# Patient Record
Sex: Female | Born: 1975 | Race: White | Hispanic: No | Marital: Married | Smoking: Never smoker
Health system: Southern US, Community
[De-identification: ages and names within clinical notes are randomized; demographics above are authoritative.]

## PROBLEM LIST (undated history)

## (undated) DIAGNOSIS — J209 Acute bronchitis, unspecified: Secondary | ICD-10-CM

## (undated) DIAGNOSIS — R030 Elevated blood-pressure reading, without diagnosis of hypertension: Secondary | ICD-10-CM

## (undated) DIAGNOSIS — Z8719 Personal history of other diseases of the digestive system: Secondary | ICD-10-CM

## (undated) DIAGNOSIS — K219 Gastro-esophageal reflux disease without esophagitis: Secondary | ICD-10-CM

## (undated) DIAGNOSIS — Z9889 Other specified postprocedural states: Secondary | ICD-10-CM

## (undated) DIAGNOSIS — D219 Benign neoplasm of connective and other soft tissue, unspecified: Secondary | ICD-10-CM

## (undated) DIAGNOSIS — D509 Iron deficiency anemia, unspecified: Secondary | ICD-10-CM

## (undated) DIAGNOSIS — F988 Other specified behavioral and emotional disorders with onset usually occurring in childhood and adolescence: Secondary | ICD-10-CM

## (undated) DIAGNOSIS — B019 Varicella without complication: Secondary | ICD-10-CM

## (undated) DIAGNOSIS — D649 Anemia, unspecified: Secondary | ICD-10-CM

## (undated) DIAGNOSIS — Z0000 Encounter for general adult medical examination without abnormal findings: Secondary | ICD-10-CM

## (undated) HISTORY — PX: WISDOM TOOTH EXTRACTION: SHX21

## (undated) HISTORY — DX: Personal history of other diseases of the digestive system: Z87.19

## (undated) HISTORY — DX: Encounter for general adult medical examination without abnormal findings: Z00.00

## (undated) HISTORY — DX: Other specified postprocedural states: Z98.890

## (undated) HISTORY — PX: OTHER SURGICAL HISTORY: SHX169

## (undated) HISTORY — DX: Varicella without complication: B01.9

## (undated) HISTORY — DX: Iron deficiency anemia, unspecified: D50.9

## (undated) HISTORY — DX: Acute bronchitis, unspecified: J20.9

## (undated) HISTORY — DX: Elevated blood-pressure reading, without diagnosis of hypertension: R03.0

---

## 2006-01-16 HISTORY — PX: MYOMECTOMY: SHX85

## 2009-01-16 LAB — HM COLONOSCOPY: HM Colonoscopy: NORMAL

## 2012-02-16 ENCOUNTER — Ambulatory Visit: Payer: Self-pay | Admitting: Family Medicine

## 2012-04-18 ENCOUNTER — Ambulatory Visit: Payer: Self-pay | Admitting: Family Medicine

## 2012-06-03 ENCOUNTER — Ambulatory Visit: Payer: Self-pay | Admitting: Family Medicine

## 2012-06-18 ENCOUNTER — Other Ambulatory Visit (HOSPITAL_COMMUNITY): Payer: Self-pay | Admitting: Obstetrics and Gynecology

## 2012-06-18 DIAGNOSIS — N979 Female infertility, unspecified: Secondary | ICD-10-CM

## 2012-07-01 ENCOUNTER — Ambulatory Visit (HOSPITAL_COMMUNITY)
Admission: RE | Admit: 2012-07-01 | Discharge: 2012-07-01 | Disposition: A | Discharge status: Home / Self Care | Payer: BC Managed Care – PPO | Source: Ambulatory Visit | Attending: Obstetrics and Gynecology | Admitting: Obstetrics and Gynecology

## 2012-07-01 DIAGNOSIS — N979 Female infertility, unspecified: Secondary | ICD-10-CM | POA: Insufficient documentation

## 2012-07-01 MED ORDER — IOHEXOL 300 MG/ML  SOLN
20.0000 mL | Freq: Once | INTRAMUSCULAR | Status: AC | PRN
  Administered 2012-07-01: 20 mL

## 2012-09-16 LAB — HM PAP SMEAR: HM Pap smear: NORMAL

## 2012-12-24 LAB — OB RESULTS CONSOLE GC/CHLAMYDIA
Chlamydia: NEGATIVE
GC PROBE AMP, GENITAL: NEGATIVE

## 2012-12-24 LAB — OB RESULTS CONSOLE RPR: RPR: NONREACTIVE

## 2012-12-24 LAB — OB RESULTS CONSOLE ABO/RH: RH Type: POSITIVE

## 2012-12-24 LAB — OB RESULTS CONSOLE HEPATITIS B SURFACE ANTIGEN: Hepatitis B Surface Ag: NEGATIVE

## 2012-12-24 LAB — OB RESULTS CONSOLE HIV ANTIBODY (ROUTINE TESTING): HIV: NONREACTIVE

## 2012-12-24 LAB — OB RESULTS CONSOLE RUBELLA ANTIBODY, IGM: Rubella: IMMUNE

## 2012-12-24 LAB — OB RESULTS CONSOLE ANTIBODY SCREEN: ANTIBODY SCREEN: NEGATIVE

## 2013-01-16 NOTE — L&D Delivery Note (Signed)
Delivery Note At 8:58 AM a viable and healthy female was delivered via Vaginal, Spontaneous Delivery (Presentation: Right Occiput Anterior).  APGAR: 8, 9; weight 6 lb 15 oz (3147 g).   Placenta status: Intact, Spontaneous.  Cord: 3 vessels with a loose nuchal cord x 1  Anesthesia: Epidural  Episiotomy: None Lacerations: None Suture Repair: None Est. Blood Loss (mL): 250  Mom to postpartum.  Baby to Couplet care / Skin to Skin.  [place] H. 06/25/2013, 9:35 AM

## 2013-06-03 ENCOUNTER — Encounter (HOSPITAL_COMMUNITY): Payer: Self-pay | Admitting: *Deleted

## 2013-06-03 ENCOUNTER — Inpatient Hospital Stay (HOSPITAL_COMMUNITY)
Admission: AD | Admit: 2013-06-03 | Discharge: 2013-06-03 | Disposition: A | Discharge status: Home / Self Care | Payer: Managed Care, Other (non HMO) | Source: Ambulatory Visit | Attending: Obstetrics and Gynecology | Admitting: Obstetrics and Gynecology

## 2013-06-03 DIAGNOSIS — O479 False labor, unspecified: Secondary | ICD-10-CM

## 2013-06-03 DIAGNOSIS — O4700 False labor before 37 completed weeks of gestation, unspecified trimester: Secondary | ICD-10-CM | POA: Insufficient documentation

## 2013-06-03 HISTORY — DX: Anemia, unspecified: D64.9

## 2013-06-03 LAB — URINALYSIS, ROUTINE W REFLEX MICROSCOPIC
BILIRUBIN URINE: NEGATIVE
Glucose, UA: NEGATIVE mg/dL
Hgb urine dipstick: NEGATIVE
KETONES UR: NEGATIVE mg/dL
Leukocytes, UA: NEGATIVE
NITRITE: NEGATIVE
PROTEIN: NEGATIVE mg/dL
SPECIFIC GRAVITY, URINE: 1.01 (ref 1.005–1.030)
UROBILINOGEN UA: 0.2 mg/dL (ref 0.0–1.0)
pH: 6 (ref 5.0–8.0)

## 2013-06-03 MED ORDER — NIFEDIPINE 10 MG PO CAPS
10.0000 mg | ORAL_CAPSULE | Freq: Once | ORAL | Status: AC
  Administered 2013-06-03: 10 mg via ORAL
  Filled 2013-06-03: qty 1

## 2013-06-03 NOTE — Discharge Instructions (Signed)

## 2013-06-03 NOTE — MAU Note (Signed)
Sent from office, contractions noted on monitor

## 2013-06-03 NOTE — MAU Provider Note (Signed)
History     CSN: [id number]  Arrival date and time: 06/03/13 1032   First Provider Initiated Contact with Patient 06/03/13 1115      Chief Complaint  Patient presents with  . Contractions    HPI  Ms. Katelyn [name] is a 38 y.o. female G2P0010 at [redacted]w[redacted]d who presents with contractions; the patient was seen in the office today and noted to have contractions on the monitor. The patient was instructed to come to MAU for further evaluation. The patient says that the contractions are not painful; she feels them frequently, however they are not painful.  Denies history of preterm delivery. Possible C-section due to history of myomectomy. The patient and the physicians are discussing all of the options.  Last intercourse was yesterday at 3 pm.    OB History   Grav Para Term Preterm Abortions TAB SAB Ect Mult Living   2    1  1    0      Past Medical History  Diagnosis Date  . Anemia     Past Surgical History  Procedure Laterality Date  . Myomectomy  2008  . Axillary cyst      left side    History reviewed. No pertinent family history.  History  Substance Use Topics  . Smoking status: Never Smoker   . Smokeless tobacco: Not on file  . Alcohol Use: No    Allergies: No Known Allergies  No prescriptions prior to admission   Results for orders placed during the hospital encounter of 06/03/13 (from the past 48 hour(s))  URINALYSIS, ROUTINE W REFLEX MICROSCOPIC     Status: None   Collection Time    06/03/13 11:15 AM      Result Value Ref Range   Color, Urine YELLOW  YELLOW   APPearance CLEAR  CLEAR   Specific Gravity, Urine 1.010  1.005 - 1.030   pH 6.0  5.0 - 8.0   Glucose, UA NEGATIVE  NEGATIVE mg/dL   Hgb urine dipstick NEGATIVE  NEGATIVE   Bilirubin Urine NEGATIVE  NEGATIVE   Ketones, ur NEGATIVE  NEGATIVE mg/dL   Protein, ur NEGATIVE  NEGATIVE mg/dL   Urobilinogen, UA 0.2  0.0 - 1.0 mg/dL   Nitrite NEGATIVE  NEGATIVE   Leukocytes, UA NEGATIVE  NEGATIVE   Comment: MICROSCOPIC NOT DONE ON URINES WITH NEGATIVE PROTEIN, BLOOD, LEUKOCYTES, NITRITE, OR GLUCOSE <1000 mg/dL.     Review of Systems  Constitutional: Negative for fever and chills.  Eyes: Negative for blurred vision.  Gastrointestinal: Positive for abdominal pain (All over her abdomen; She currently rates her pain 0-1). Negative for nausea and vomiting.  Genitourinary: Negative for dysuria, urgency, frequency and hematuria.       No vaginal discharge. No vaginal bleeding. No dysuria.   Neurological: Negative for headaches.   Physical Exam   Blood pressure 116/82, pulse 83, temperature 97.9 F (36.6 C), temperature source Oral, resp. rate 18, height 5' 3.75" (1.619 m), weight 72.122 kg (159 lb), last menstrual period 06/23/2012, SpO2 100.00%.  Physical Exam  Constitutional: She is oriented to person, place, and time. She appears well-developed and well-nourished. No distress.  HENT:  Head: Normocephalic.  Eyes: Pupils are equal, round, and reactive to light.  Neck: Neck supple.  Respiratory: Effort normal.  GI: Soft. She exhibits no distension. There is no tenderness. There is no rebound.  Genitourinary:  Cervix: Closed, thick, posterior   Musculoskeletal: Normal range of motion.  Neurological: She is alert and oriented to person,  place, and time.  Skin: Skin is warm. She is not diaphoretic.  Psychiatric: Her behavior is normal.   Fetal Tracing: Baseline: 120 bpm  Variability: Moderate  Accelerations: 15x15 Decelerations: None Toco: 2-3 mins apart with an irregular pattern (prior to procardia and po hydration).    MAU Course  Procedures None  MDM Spoke to Dr. Philis Pique Contractions 2-4 mins apart; irregular pattern. Pt rates her pain 0-1 Fetal fibronectin not collected; intercourse in the last 24 hours One dose of procardia 10 mg. PO hydration.    Discussed patient status with Dr. Philis Pique following procardia; po fluids.  BP normal; pt reports a decrease in the  frequency of contractions.   Assessment and Plan   A:  1. Braxton Hicks contractions    P:  Discharge home in stable condition Preterm labor precautions Follow up as scheduled in the office  Return to MAU with any worsening symptoms.   [name] [name], NP 06/03/2013, 1:35 PM

## 2013-06-03 NOTE — MAU Note (Signed)
Pt was seen by Dr. Ouida Sills in the office for well check up.  Pt reports doppler was 110's, so they decided to do EFM.  Baseline FHR unchanged but pt states she was having several U/C's.  Sent to MAU for further evaluation.  Pt denies pain.  Denies vaginal bleeding or ROM.  Good fetal movement.

## 2013-06-09 LAB — OB RESULTS CONSOLE GBS: STREP GROUP B AG: NEGATIVE

## 2013-06-17 ENCOUNTER — Inpatient Hospital Stay (HOSPITAL_COMMUNITY)
Admission: AD | Admit: 2013-06-17 | Discharge: 2013-06-17 | Disposition: A | Discharge status: Home / Self Care | Payer: Managed Care, Other (non HMO) | Source: Ambulatory Visit | Attending: Obstetrics and Gynecology | Admitting: Obstetrics and Gynecology

## 2013-06-17 ENCOUNTER — Encounter (HOSPITAL_COMMUNITY): Payer: Self-pay | Admitting: *Deleted

## 2013-06-17 DIAGNOSIS — N898 Other specified noninflammatory disorders of vagina: Secondary | ICD-10-CM | POA: Insufficient documentation

## 2013-06-17 DIAGNOSIS — O4700 False labor before 37 completed weeks of gestation, unspecified trimester: Secondary | ICD-10-CM | POA: Insufficient documentation

## 2013-06-17 LAB — POCT FERN TEST

## 2013-06-17 NOTE — MAU Provider Note (Signed)
S: 38 yo G2P0010 @ [redacted]w[redacted]d who presented for contractions and vaginal discharge.  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 Now improving.  - +FM. No VB.   O:   GU: NEFG, normal discharge and vaginal vault. No pool. Cervix normal in appearance SVE: 1.5/30/-2 FWB- 135, mod var, +accels, no decels  A/P - neg fern and neg pool - cervix as above. - Cat 1 Tracing  - results handed off to nursing who is to contact Dr. Rogue Bussing for further instructions.    Kassie Mends, MD

## 2013-06-17 NOTE — MAU Note (Signed)
Urine in lab 

## 2013-06-17 NOTE — MAU Note (Signed)
Contracting during the night, not as bad now.  No bleeding.  ? Ammonia smell- ? Urine vs leaking

## 2013-06-17 NOTE — MAU Note (Signed)
C/o ucs since 0300; ? SROM;

## 2013-06-17 NOTE — MAU Note (Signed)
Had to leave work around 0500 due to painful ucs; pain was 6 but now is a 2; ? leaking of fluid; yellowish discharge; has a doctor's appointment at 1000;

## 2013-06-17 NOTE — Discharge Instructions (Signed)
Natural Childbirth Natural childbirth is going through labor and delivery without any drugs to relieve pain. You also do not use fetal monitors, have a cesarean delivery, or get a sugical cut to enlarge the vaginal opening (episiotomy). With the help of a birthing professional (midwife), you will direct your own labor and delivery as you choose. Many women chose natural childbirth because they feel more in control and in touch with their labor and delivery. They are also concerned about the medications affecting themselves and the baby. Pregnant women with a high risk pregnancy should not attempt natural childbirth. It is better to deliver the infant in a hospital if an emergency situation arises. Sometimes, the caregiver has to intervene for the health and safety of the mother and infant. TWO TECHNIQUES FOR NATURAL CHILDBIRTH:   The Lamaze method. This method teaches women that having a baby is normal, healthy, and natural. It also teaches the mother to take a neutral position regarding pain medication and anesthesia and to make an informed decision if and when it is right for them.  The Bradley method (also called husband coached birth). This method teaches the father to be the birth coach and stresses a natural approach. It also encourages exercise and a balanced diet with good nutrition. The exercises teach relaxation and deep breathing techniques. However, there are also classes to prepare the parents for an emergency situation that may occur. METHODS OF DEALING WITH LABOR PAIN AND DELIVERY:  Meditation.  Yoga.  Hypnosis.  Acupuncture.  Massage.  Changing positions (walking, rocking, showering, leaning on birth balls).  Lying in warm water or a jacuzzi.  Find an activity that keeps your mind off of the labor pain.  Listen to soft music.  Visual imagery (focus on a particular object). BEFORE GOING INTO LABOR  Be sure you and your spouse/partner are in agreement to have natural  childbirth.  Decide if your caregiver or a midwife will deliver your baby.  Decide if you will have your baby in the hospital, birthing center, or at home.  If you have children, make plans to have someone to take care of them when you go to the hospital.  Know the distance and the time it takes to go to the delivery center. Make a dry run to be sure.  Have a bag packed with a night gown, bathrobe, and toiletries ready to take when you go into labor.  Keep phone numbers of your family and friends handy if you need to call someone when you go into labor.  Your spouse or partner should go to all the teaching classes.  Talk with your caregiver about the possibility of a medical emergency and what will happen if that occurs. ADVANTAGES OF NATURAL CHILDBIRTH  You are in control of your labor and delivery.  It is safe.  There are no medications or anesthetics that may affect you and the fetus.  There are no invasive procedures such as an episiotomy.  You and your partner will work together, which can increase your bond.  Meditation, yoga, massage, and breathing exercises can be learned while pregnant and help you when you are in labor and at delivery.  In most delivery centers, the family and friends can be involved in the labor and delivery process. DISADVANTAGES OF NATURAL CHILDBIRTH  You will experience pain during your labor and delivery.  The methods of helping relieve your labor pains may not work for you.  You may feel embarrassed, disappointed, and like a failure   if you decide to change your mind during labor and not have natural childbirth. °AFTER THE DELIVERY °· You will be very tired. °· You will be uncomfortable because of your uterus contracting. You will feel soreness around the vagina. °· You may feel cold and shaky.This is a natural reaction. °· You will be excited, overwhelmed, accomplished, and proud to be a mother. °HOME CARE INSTRUCTIONS  °· Follow the advice and  instructions of your caregiver. °· Follow the instructions of your natural childbirth instructor (Lamaze or Bradley Method). °Document Released: 12/16/2007 Document Revised: 03/27/2011 Document Reviewed: 09/09/2012 °ExitCare® Patient Information ©2014 [employer]. ° °

## 2013-06-24 ENCOUNTER — Encounter (HOSPITAL_COMMUNITY): Payer: Self-pay | Admitting: *Deleted

## 2013-06-24 ENCOUNTER — Inpatient Hospital Stay (HOSPITAL_COMMUNITY)
Admission: AD | Admit: 2013-06-24 | Discharge: 2013-06-26 | DRG: 775 | Disposition: A | Discharge status: Home / Self Care | Payer: Managed Care, Other (non HMO) | Source: Ambulatory Visit | Attending: Obstetrics and Gynecology | Admitting: Obstetrics and Gynecology

## 2013-06-24 DIAGNOSIS — K219 Gastro-esophageal reflux disease without esophagitis: Secondary | ICD-10-CM | POA: Diagnosis present

## 2013-06-24 HISTORY — DX: Gastro-esophageal reflux disease without esophagitis: K21.9

## 2013-06-24 LAB — POCT FERN TEST: POCT FERN TEST: NEGATIVE

## 2013-06-24 LAB — CBC
HCT: 36.9 % (ref 36.0–46.0)
Hemoglobin: 12.3 g/dL (ref 12.0–15.0)
MCH: 28.1 pg (ref 26.0–34.0)
MCHC: 33.3 g/dL (ref 30.0–36.0)
MCV: 84.2 fL (ref 78.0–100.0)
PLATELETS: 189 10*3/uL (ref 150–400)
RBC: 4.38 MIL/uL (ref 3.87–5.11)
RDW: 15.7 % — AB (ref 11.5–15.5)
WBC: 9.9 10*3/uL (ref 4.0–10.5)

## 2013-06-24 MED ORDER — ACETAMINOPHEN 325 MG PO TABS: 650.0000 mg | ORAL_TABLET | ORAL | Status: DC | PRN

## 2013-06-24 MED ORDER — IBUPROFEN 600 MG PO TABS
600.0000 mg | ORAL_TABLET | Freq: Four times a day (QID) | ORAL | Status: DC | PRN
  Administered 2013-06-25: 600 mg via ORAL
  Filled 2013-06-24: qty 1

## 2013-06-24 MED ORDER — TERBUTALINE SULFATE 1 MG/ML IJ SOLN: 0.2500 mg | Freq: Once | INTRAMUSCULAR | Status: AC | PRN

## 2013-06-24 MED ORDER — LACTATED RINGERS IV SOLN
500.0000 mL | INTRAVENOUS | Status: DC | PRN
Start: 2013-06-24 — End: 2013-06-25
  Administered 2013-06-25: 500 mL via INTRAVENOUS

## 2013-06-24 MED ORDER — FLEET ENEMA 7-19 GM/118ML RE ENEM: 1.0000 | ENEMA | RECTAL | Status: DC | PRN

## 2013-06-24 MED ORDER — OXYCODONE-ACETAMINOPHEN 5-325 MG PO TABS
1.0000 | ORAL_TABLET | ORAL | Status: DC | PRN
  Filled 2013-06-24: qty 1

## 2013-06-24 MED ORDER — OXYTOCIN BOLUS FROM INFUSION: 500.0000 mL | INTRAVENOUS | Status: DC

## 2013-06-24 MED ORDER — LIDOCAINE HCL (PF) 1 % IJ SOLN
30.0000 mL | INTRAMUSCULAR | Status: DC | PRN
  Filled 2013-06-24: qty 30

## 2013-06-24 MED ORDER — CITRIC ACID-SODIUM CITRATE 334-500 MG/5ML PO SOLN
30.0000 mL | ORAL | Status: DC | PRN
  Administered 2013-06-25: 30 mL via ORAL
  Filled 2013-06-24: qty 15

## 2013-06-24 MED ORDER — OXYTOCIN 40 UNITS IN LACTATED RINGERS INFUSION - SIMPLE MED
1.0000 m[IU]/min | INTRAVENOUS | Status: DC
  Administered 2013-06-25: 2 m[IU]/min via INTRAVENOUS
  Filled 2013-06-24: qty 1000

## 2013-06-24 MED ORDER — LACTATED RINGERS IV SOLN
INTRAVENOUS | Status: DC
  Administered 2013-06-24 – 2013-06-25 (×3): via INTRAVENOUS

## 2013-06-24 MED ORDER — OXYTOCIN 40 UNITS IN LACTATED RINGERS INFUSION - SIMPLE MED
62.5000 mL/h | INTRAVENOUS | Status: DC
  Administered 2013-06-25: 999 mL/h via INTRAVENOUS

## 2013-06-24 MED ORDER — ONDANSETRON HCL 4 MG/2ML IJ SOLN: 4.0000 mg | Freq: Four times a day (QID) | INTRAMUSCULAR | Status: DC | PRN

## 2013-06-25 ENCOUNTER — Encounter (HOSPITAL_COMMUNITY): Payer: Managed Care, Other (non HMO) | Admitting: Anesthesiology

## 2013-06-25 ENCOUNTER — Encounter (HOSPITAL_COMMUNITY): Payer: Self-pay | Admitting: *Deleted

## 2013-06-25 ENCOUNTER — Inpatient Hospital Stay (HOSPITAL_COMMUNITY): Payer: Managed Care, Other (non HMO) | Admitting: Anesthesiology

## 2013-06-25 LAB — TYPE AND SCREEN
ABO/RH(D): O POS
ANTIBODY SCREEN: NEGATIVE

## 2013-06-25 LAB — ABO/RH: ABO/RH(D): O POS

## 2013-06-25 LAB — RPR

## 2013-06-25 MED ORDER — OXYCODONE-ACETAMINOPHEN 5-325 MG PO TABS
1.0000 | ORAL_TABLET | ORAL | Status: DC | PRN
  Administered 2013-06-25 – 2013-06-26 (×4): 1 via ORAL
  Filled 2013-06-25 (×3): qty 1

## 2013-06-25 MED ORDER — DIBUCAINE 1 % RE OINT: 1.0000 "application " | TOPICAL_OINTMENT | RECTAL | Status: DC | PRN

## 2013-06-25 MED ORDER — PHENYLEPHRINE 40 MCG/ML (10ML) SYRINGE FOR IV PUSH (FOR BLOOD PRESSURE SUPPORT)
PREFILLED_SYRINGE | INTRAVENOUS | Status: AC
  Filled 2013-06-25: qty 10

## 2013-06-25 MED ORDER — EPHEDRINE 5 MG/ML INJ
10.0000 mg | INTRAVENOUS | Status: DC | PRN
  Filled 2013-06-25: qty 2

## 2013-06-25 MED ORDER — DIPHENHYDRAMINE HCL 25 MG PO CAPS
25.0000 mg | ORAL_CAPSULE | Freq: Four times a day (QID) | ORAL | Status: DC | PRN
  Administered 2013-06-25: 25 mg via ORAL
  Filled 2013-06-25: qty 1

## 2013-06-25 MED ORDER — LACTATED RINGERS IV SOLN
500.0000 mL | Freq: Once | INTRAVENOUS | Status: AC
  Administered 2013-06-25: 500 mL via INTRAVENOUS

## 2013-06-25 MED ORDER — LIDOCAINE HCL (PF) 1 % IJ SOLN
INTRAMUSCULAR | Status: DC | PRN
  Administered 2013-06-25 (×5): 4 mL

## 2013-06-25 MED ORDER — TETANUS-DIPHTH-ACELL PERTUSSIS 5-2.5-18.5 LF-MCG/0.5 IM SUSP
0.5000 mL | Freq: Once | INTRAMUSCULAR | Status: AC
  Administered 2013-06-26: 0.5 mL via INTRAMUSCULAR
  Filled 2013-06-25: qty 0.5

## 2013-06-25 MED ORDER — PHENYLEPHRINE 40 MCG/ML (10ML) SYRINGE FOR IV PUSH (FOR BLOOD PRESSURE SUPPORT)
80.0000 ug | PREFILLED_SYRINGE | INTRAVENOUS | Status: DC | PRN
  Administered 2013-06-25: 80 ug via INTRAVENOUS
  Filled 2013-06-25: qty 2

## 2013-06-25 MED ORDER — PHENYLEPHRINE 40 MCG/ML (10ML) SYRINGE FOR IV PUSH (FOR BLOOD PRESSURE SUPPORT)
80.0000 ug | PREFILLED_SYRINGE | INTRAVENOUS | Status: DC | PRN
  Filled 2013-06-25: qty 2

## 2013-06-25 MED ORDER — FENTANYL 2.5 MCG/ML BUPIVACAINE 1/10 % EPIDURAL INFUSION (WH - ANES): 14.0000 mL/h | INTRAMUSCULAR | Status: DC | PRN

## 2013-06-25 MED ORDER — ONDANSETRON HCL 4 MG/2ML IJ SOLN
4.0000 mg | INTRAMUSCULAR | Status: DC | PRN
Start: 2013-06-25 — End: 2013-06-27

## 2013-06-25 MED ORDER — EPHEDRINE 5 MG/ML INJ
INTRAVENOUS | Status: AC
  Filled 2013-06-25: qty 4

## 2013-06-25 MED ORDER — ZOLPIDEM TARTRATE 5 MG PO TABS: 5.0000 mg | ORAL_TABLET | Freq: Every evening | ORAL | Status: DC | PRN

## 2013-06-25 MED ORDER — FENTANYL 2.5 MCG/ML BUPIVACAINE 1/10 % EPIDURAL INFUSION (WH - ANES)
INTRAMUSCULAR | Status: AC
  Administered 2013-06-25: 14 mL/h via EPIDURAL
  Filled 2013-06-25: qty 125

## 2013-06-25 MED ORDER — BENZOCAINE-MENTHOL 20-0.5 % EX AERO
1.0000 "application " | INHALATION_SPRAY | CUTANEOUS | Status: DC | PRN
  Administered 2013-06-25 – 2013-06-26 (×2): 1 via TOPICAL
  Filled 2013-06-25 (×2): qty 56

## 2013-06-25 MED ORDER — ONDANSETRON HCL 4 MG PO TABS: 4.0000 mg | ORAL_TABLET | ORAL | Status: DC | PRN

## 2013-06-25 MED ORDER — PRENATAL MULTIVITAMIN CH
1.0000 | ORAL_TABLET | Freq: Every day | ORAL | Status: DC
  Administered 2013-06-25 – 2013-06-26 (×2): 1 via ORAL
  Filled 2013-06-25 (×2): qty 1

## 2013-06-25 MED ORDER — IBUPROFEN 600 MG PO TABS
600.0000 mg | ORAL_TABLET | Freq: Four times a day (QID) | ORAL | Status: DC
  Administered 2013-06-25 – 2013-06-26 (×5): 600 mg via ORAL
  Filled 2013-06-25 (×5): qty 1

## 2013-06-25 MED ORDER — SIMETHICONE 80 MG PO CHEW
80.0000 mg | CHEWABLE_TABLET | ORAL | Status: DC | PRN
Start: 2013-06-25 — End: 2013-06-27
  Administered 2013-06-25 – 2013-06-26 (×2): 80 mg via ORAL
  Filled 2013-06-25 (×2): qty 1

## 2013-06-25 MED ORDER — SENNOSIDES-DOCUSATE SODIUM 8.6-50 MG PO TABS
2.0000 | ORAL_TABLET | ORAL | Status: DC
  Administered 2013-06-25: 2 via ORAL
  Filled 2013-06-25: qty 2

## 2013-06-25 MED ORDER — LANOLIN HYDROUS EX OINT: TOPICAL_OINTMENT | CUTANEOUS | Status: DC | PRN

## 2013-06-25 MED ORDER — WITCH HAZEL-GLYCERIN EX PADS: 1.0000 "application " | MEDICATED_PAD | CUTANEOUS | Status: DC | PRN

## 2013-06-25 MED ORDER — DIPHENHYDRAMINE HCL 50 MG/ML IJ SOLN: 12.5000 mg | INTRAMUSCULAR | Status: DC | PRN

## 2013-06-25 NOTE — Anesthesia Preprocedure Evaluation (Signed)
Anesthesia Evaluation  Patient identified by MRN, date of birth, ID band Patient awake    Reviewed: Allergy & Precautions, H&P , NPO status , Patient's Chart, lab work & pertinent test results, reviewed documented beta blocker date and time   History of Anesthesia Complications Negative for: history of anesthetic complications  Airway Mallampati: I TM Distance: >3 FB Neck ROM: full    Dental  (+) Teeth Intact   Pulmonary neg pulmonary ROS,  breath sounds clear to auscultation        Cardiovascular negative cardio ROS  Rhythm:regular Rate:Normal     Neuro/Psych negative neurological ROS  negative psych ROS   GI/Hepatic Neg liver ROS, GERD-  Medicated,  Endo/Other  negative endocrine ROS  Renal/GU negative Renal ROS     Musculoskeletal   Abdominal   Peds  Hematology negative hematology ROS (+)   Anesthesia Other Findings   Reproductive/Obstetrics (+) Pregnancy                           Anesthesia Physical Anesthesia Plan  ASA: II  Anesthesia Plan: Epidural   Post-op Pain Management:    Induction:   Airway Management Planned:   Additional Equipment:   Intra-op Plan:   Post-operative Plan:   Informed Consent: I have reviewed the patients History and Physical, chart, labs and discussed the procedure including the risks, benefits and alternatives for the proposed anesthesia with the patient or authorized representative who has indicated his/her understanding and acceptance.     Plan Discussed with:   Anesthesia Plan Comments:         Anesthesia Quick Evaluation

## 2013-06-25 NOTE — Progress Notes (Signed)
Informed Dr. Rogue Bussing of SVE, uterine activity, request to shut off pitocin, fetal variables, early, and occasional prolonged.

## 2013-06-25 NOTE — Anesthesia Procedure Notes (Signed)
Epidural Patient location during procedure: OB Start time: 06/25/2013 2:09 AM  Staffing Performed by: anesthesiologist   Preanesthetic Checklist Completed: patient identified, site marked, surgical consent, pre-op evaluation, timeout performed, IV checked, risks and benefits discussed and monitors and equipment checked  Epidural Patient position: sitting Prep: site prepped and draped and DuraPrep Patient monitoring: continuous pulse ox and blood pressure Approach: midline Injection technique: LOR air  Needle:  Needle type: Tuohy  Needle gauge: 17 G Needle length: 9 cm and 9 Needle insertion depth: 5 cm cm Catheter type: closed end flexible Catheter size: 19 Gauge Catheter at skin depth: 10 cm Test dose: negative  Assessment Events: blood not aspirated, injection not painful, no injection resistance, negative IV test and no paresthesia  Additional Notes Discussed risk of headache, infection, bleeding, nerve injury and failed or incomplete block.  Patient voices understanding and wishes to proceed.  Epidural placed easily on first attempt. No paresthesia.  Patient tolerated procedure well with no apparent complications.  Charlton Haws, MDReason for block:procedure for pain

## 2013-06-26 ENCOUNTER — Ambulatory Visit: Payer: BC Managed Care – PPO | Admitting: Family Medicine

## 2013-06-26 LAB — CBC
HEMATOCRIT: 32.4 % — AB (ref 36.0–46.0)
Hemoglobin: 10.4 g/dL — ABNORMAL LOW (ref 12.0–15.0)
MCH: 27.3 pg (ref 26.0–34.0)
MCHC: 32.1 g/dL (ref 30.0–36.0)
MCV: 85 fL (ref 78.0–100.0)
PLATELETS: 149 10*3/uL — AB (ref 150–400)
RBC: 3.81 MIL/uL — ABNORMAL LOW (ref 3.87–5.11)
RDW: 15.8 % — AB (ref 11.5–15.5)
WBC: 11.9 10*3/uL — AB (ref 4.0–10.5)

## 2013-06-26 NOTE — Anesthesia Postprocedure Evaluation (Signed)
  Anesthesia Post-op Note  Patient: Katelyn [name]  Procedure(s) Performed: * No procedures listed *  Patient Location: Mother/Baby  Anesthesia Type:Epidural  Level of Consciousness: awake, alert , oriented and patient cooperative  Airway and Oxygen Therapy: Patient Spontanous Breathing  Post-op Pain: mild  Post-op Assessment: Patient's Cardiovascular Status Stable, Respiratory Function Stable, No headache, No backache, No residual numbness and No residual motor weakness  Post-op Vital Signs: stable  Last Vitals:  Filed Vitals:   06/26/13 0500  BP: 135/82  Pulse: 70  Temp: 36.7 C  Resp: 20    Complications: No apparent anesthesia complications

## 2013-06-26 NOTE — Lactation Note (Addendum)
This note was copied from the chart of Katelyn Marchelle Rinella. Lactation Consultation Note  Initial consult:  Baby has been sleepy and has had a difficult time latching.  Disorganized suck.  Reviewed suck training, breast massage and hand expression.  Received drops with hand expression. Attempted bf in all three positions with breast compression.  Baby would not sustain latch. Set up DEBP. Mother was able to pump 2 ml of colostrum. Demonstrated spoon feeding and finger syringe feeding.  Introduced #20NS. Prefilled NS with colostrum.  Baby demonstrated rhytmical suck and swallows in fb position. LS7. Plan is for mother to post pump 4-6 times a day for 15 min. And give baby back volume pumped.   Provided volume guidelines and watch for colostrum in NS. Mother needs help with positioning and reminded her to massage her breasts to keep baby active at breast. Suggested she try to attempt bf without NS each time.  Encouarged mother to call RN or [facility] for further assistance.  Mom made aware of O/P services, breastfeeding support groups, community resources, and our phone # for post-discharge questions.    Patient Name: Katelyn [name] [id number] Date: 06/26/2013 Reason for consult: Initial assessment   Maternal Data Infant to breast within first hour of birth: Yes Has patient been taught Hand Expression?: Yes Does the patient have breastfeeding experience prior to this delivery?: No  Feeding Feeding Type: Breast Fed Length of feed: 0 min  LATCH Score/Interventions Latch: Repeated attempts needed to sustain latch, nipple held in mouth throughout feeding, stimulation needed to elicit sucking reflex. Intervention(s): Adjust position;Assist with latch;Breast massage;Breast compression  Audible Swallowing: A few with stimulation Intervention(s): Hand expression;Skin to skin  Type of Nipple: Everted at rest and after stimulation  Comfort (Breast/Nipple): Soft / non-tender     Hold  (Positioning): Assistance needed to correctly position infant at breast and maintain latch.  LATCH Score: 7  Lactation Tools Discussed/Used Tools: Nipple [place] Fuel;Pump Nipple shield size: 20 Pump Review: Setup, frequency, and cleaning;Milk Storage Initiated by:: Vivianne Master RN Date initiated:: 06/26/13   Consult Status Consult Status: Follow-up Date: 06/27/13 Follow-up type: In-patient    Vivianne Master [facility] 06/26/2013, 10:37 AM

## 2013-06-26 NOTE — Discharge Summary (Signed)
Obstetric Discharge Summary Reason for Admission: onset of labor Prenatal Procedures: none Intrapartum Procedures: spontaneous vaginal delivery Postpartum Procedures: none Complications-Operative and Postpartum: none Hemoglobin  Date Value Ref Range Status  06/26/2013 10.4* 12.0 - 15.0 g/dL Final     HCT  Date Value Ref Range Status  06/26/2013 32.4* 36.0 - 46.0 % Final     Discharge Diagnoses: Term Pregnancy-delivered  Discharge Information: Date: 06/26/2013 Activity: pelvic rest Diet: routine Medications: Ibuprofen Condition: stable Instructions: refer to practice specific booklet Discharge to: home Follow-up Information   Follow up with Marcial Pacas., MD In 4 weeks.   Specialty:  Obstetrics and Gynecology   Contact information:   [address] [place] [address] [phone]       Newborn Data: Live born female  Birth Weight: 6 lb 15 oz (3147 g) APGAR: 8, 9  Home with mother.  Katelyn [name] A 06/26/2013, 8:06 AM

## 2013-06-26 NOTE — Progress Notes (Signed)
Patient is eating, ambulating, voiding.  Pain control is good.  Filed Vitals:   06/25/13 1220 06/25/13 1620 06/25/13 2200 06/26/13 0500  BP: 113/73 125/80 121/82 135/82  Pulse: 54 56 81 70  Temp: 97.2 F (36.2 C) 98.1 F (36.7 C) 98.1 F (36.7 C) 98.1 F (36.7 C)  TempSrc: Oral Oral Oral Oral  Resp: 20 18 18 20   Height:      Weight:      SpO2:   99%     Fundus firm Perineum without swelling.  Lab Results  Component Value Date   WBC 11.9* 06/26/2013   HGB 10.4* 06/26/2013   HCT 32.4* 06/26/2013   MCV 85.0 06/26/2013   PLT 149* 06/26/2013    --/--/O POS, O POS (06/09 2300)/RI  A/P Post partum day 1.  Routine care.  Expect d/c routine.    Roshanna Cimino A

## 2013-06-28 ENCOUNTER — Ambulatory Visit: Payer: Self-pay

## 2013-06-28 NOTE — Lactation Note (Addendum)
This note was copied from the chart of Katelyn [name]. Lactation Consultation Note      Follow up consult with this  Mom and baby, now 72 hours post partum, and baby is 37 5/7 weeks CGA. Baby has been under phototherapy, mom's milk is just beginning  to transition in ( pumping 5 mls of EBM),  And baby is being supplemented with EBM/formula after breast feeding, amounts according to the [facility] chart. Mom was reported to be stressed, with sore nipples. I assisted mom with latching baby in FB position. Mom has anticipitory pain - I explained that I could see her tense her body, before I even touched her breast. Mom was able to relax with latched baby, and realize that in a minute after latch, she has minimal discomfort. I also explained that her milk should be transitioning in today, which is going to make everything easier. Breast care review, comfort gels given with instruction. Mom has a huge baby shower today, but as decided to have dad go, and she will stay with baby for feeding. Plan is to supplement  with EBM  Prior to latching, since mom says this seems to get the baby alert and showing cues. After breast feeding, formula will be offerred by bottle, and mom will pump , and save this EBm for next feeding. lactaion o/p appointment made for 6/17 at 9 am. I will assist mom with latching at next feed, prio to baby gon ghome.  Patient Name: Katelyn [name] [id number] Date: 06/28/2013 Reason for consult: Follow-up assessment   Maternal Data    Feeding Feeding Type: Breast Fed Length of feed: 12 min  LATCH Score/Interventions Latch: Grasps breast easily, tongue down, lips flanged, rhythmical sucking. Intervention(s): Adjust position;Assist with latch;Breast massage;Breast compression  Audible Swallowing: A few with stimulation Intervention(s): Skin to skin;Hand expression Intervention(s): Skin to skin  Type of Nipple: Everted at rest and after stimulation  Comfort (Breast/Nipple): Filling,  red/small blisters or bruises, mild/mod discomfort  Problem noted: Mild/Moderate discomfort Interventions (Mild/moderate discomfort): Comfort gels  Hold (Positioning): Assistance needed to correctly position infant at breast and maintain latch. Intervention(s): Breastfeeding basics reviewed;Support Pillows;Position options;Skin to skin  LATCH Score: 7  Lactation Tools Discussed/Used Pump Review: Setup, frequency, and cleaning;Milk Storage;Other (comment) (hand expression, part care, breast care, massage for egorement prn)   Consult Status Consult Status: Follow-up Date: 07/02/13 (9 am o/p lactation) Follow-up type: Out-patient    [name] 06/28/2013, 9:48 AM

## 2013-06-29 ENCOUNTER — Ambulatory Visit: Payer: Self-pay

## 2013-06-29 NOTE — Lactation Note (Signed)
This note was copied from the chart of Katelyn [name]. Lactation Consultation Note     Follow up consult with this mom and baby, now 15 days old. Mom became enogorged last night, and at this time, her right breast is firm.  I did reverse massage on mom's breasts, toward her armpits, and her breast softened in about 15 minutes. She then was able to pump an increased amount of milk, which made mom happy. The milk she pumped p[rior to the massage was whit, and after was much thicker and bright yellow. Mom has decided to pump and bottle feed, at least until her o/p lactation appointment on 6/17. i told her that was a good idea, and should ive her time to heal her nipples. Mom is pleased with this plan. Baby was content, sleeping in her crib, at this time.  Patient Name: Katelyn [name] [id number] Date: 06/29/2013 Reason for consult: Follow-up assessment   Maternal Data    Feeding Feeding Type: Breast Milk Nipple Type: Slow - flow  LATCH Score/Interventions          Comfort (Breast/Nipple): Engorged, cracked, bleeding, large blisters, severe discomfort Problem noted: Engorgment  Problem noted: Severe discomfort Interventions (Mild/moderate discomfort): Hand massage;Reverse pressue;Post-pump;Comfort gels        Lactation Tools Discussed/Used     Consult Status Consult Status: Complete Date: 07/02/13 Follow-up type: Out-patient    [name] 06/29/2013, 12:45 PM

## 2013-07-02 ENCOUNTER — Ambulatory Visit: Payer: Self-pay

## 2013-07-02 NOTE — Lactation Note (Signed)
This note was copied from the chart of Katelyn Katelyn [name]. Infant Lactation Consultation Outpatient Visit Note  Patient Name: Katelyn Katelyn [name] Date of Birth: 06/25/2012 Birth Weight:  6 lb 15 oz (3147 g) Gestational Age at Delivery: Gestational Age: [redacted]w[redacted]d Type of Delivery: Vaginal delivery  D/c weight - 608 oz  1st Dr. Visit - 6-9.6 oz Today's weight - 6-10.3 oz 3014 g  Reason for today's visit - Breast feeding assist   Breastfeeding History - per mom a lot work feeding and pumping in the hospital has improved at home .  Frequency of Breastfeeding: every 3-4 , sometimes 5 hours  Length of Feeding: 15 mins , then supplementing  Voids: 8-7 wet  Stools:  6-8 yellow   Supplementing / Method:supplementing only with EBM , was also with formula  Pumping:  Type of Pump:DEBP Medela    Frequency: every 3-5 hours   Volume:  30-60 ml   Comments: using #27 flange , LC suggested decreasing to #24 if comfortable use it , per mom mentioned her swelling has decreased    Consultation Evaluation: Both breast full to soft in areas , no plugged ducts or engorgement noted.   Last feeding at home - 645am 60 ml of EBM   Initial Feeding Assessment: Pre-feed Weight: 6-10 .3 oz 3014 g  Post-feed Weight: 6-11.1 oz 3036 g  Amount Transferred: 22 ml  Comments: Mom needed assist with latch and obtaining depth at the breast , multiply swallows noted , and intermittent non -nutritive sucking pattern.                      Requiring stimulation  Additional Feeding Assessment:- yellowish brown stool changed and re-weight  Pre-feed Weight: 6-11.0 oz 3032 g  Post-feed Weight: 6 - 11.2 oz 3038 g  Amount Transferred: 5 ml  Comments:  Additional Feeding Assessment: wet diaper changed  Pre-feed Weight: 6-10.6 oz 3022 g  Post-feed Weight: 6-11.5 oz 3048 g  Amount Transferred: 26 ml  Comments: improved latch with multiply swallows   Total Breast milk Transferred this Visit: 53 ml  Total Supplement Given: none   Lactation  Plan of Care - Keep up  the great efforts breast feeding and pumping                                          - Mom, rest , naps , plenty fluids ( esp water ) , nutritious snacks and meals                                          - try decreasing flange size to #24 and see how it feels , comfortable - use the #24                                          - Goal - increase Katelyn Katelyn [name]'s weight by 14 days of life                                          - Feed Katelyn [name] on 1st breast for  15 -20 mins , watch for hanging out,                                          - After 15-70mins at the breast - supplement with 30 -45 ml of EBM                                          - Post pump for 10 -20 mins , save milk , don't watch bottles with pumping                                         - next feeding rotate to other breast , and do the same                                          - Stressed the importance of feeding every 2-3 hours , and if the Katelyn isn't into feeding at 3 hours may need an appetizer with the bottle                 Goal is to get her gaining and establish milk supply.    Follow-Up- - per mom F/U with Dr. Penni Homans tomorrow for weight check                      - LC recommended contacting [facility] to come for weight check next Monday June 22nd                    - F/U [facility] visit next Thursday 6/25 at 9 am for feeding assessment and weight check       Myer Haff 07/02/2013, 9:53 AM

## 2013-07-03 NOTE — H&P (Signed)
Katelyn [name] is a 38 y.o. female presenting for labor  G2P1011 @ 37+ weeks admitted for labor. Uncomplicated pregnancy. H/O superficial myomectomy. OK for TOL History OB History as of 06/26/13   Grav Para Term Preterm Abortions TAB SAB Ect Mult Living   2 1 1  1  1   1      Past Medical History  Diagnosis Date  . Anemia   . GERD (gastroesophageal reflux disease)    Past Surgical History  Procedure Laterality Date  . Myomectomy  2008  . Axillary cyst      left side  . Wisdom tooth extraction     Family History: family history is negative for Alcohol abuse, Arthritis, Asthma, Birth defects, Cancer, COPD, Depression, Diabetes, Drug abuse, Early death, Hearing loss, Heart disease, Hyperlipidemia, Hypertension, Kidney disease, Learning disabilities, Mental illness, Mental retardation, Miscarriages / Stillbirths, Stroke, Vision loss, and Varicose Veins. Social History:  reports that she has never smoked. She does not have any smokeless tobacco history on file. She reports that she does not drink alcohol or use illicit drugs.  ROS  Dilation:  (svd) Effacement (%): 100 Station: +2 Exam by:: Hayes Blood pressure 135/82, pulse 70, temperature 98.1 F (36.7 C), temperature source Oral, resp. rate 20, height 5\' 3"  (1.6 m), weight 72.576 kg (160 lb), last menstrual period 06/23/2012, SpO2 99.00%, unknown if currently breastfeeding. Exam Physical Exam  Prenatal labs: ABO, Rh: --/--/O POS, O POS (06/09 2300) Antibody: NEG (06/09 2300) Rubella: Immune (12/09 0000) RPR: NON REAC (06/09 2230)  HBsAg: Negative (12/09 0000)  HIV: Non-reactive (12/09 0000)  GBS: Negative (05/25 0000)   Assessment/Plan: 1) admit   ROSS,KENDRA H. 07/03/2013, 3:41 PM

## 2013-07-07 NOTE — Progress Notes (Signed)
Ur chart review completed.  

## 2013-07-10 ENCOUNTER — Ambulatory Visit (HOSPITAL_COMMUNITY): Admission: RE | Admit: 2013-07-10 | Payer: Managed Care, Other (non HMO) | Source: Ambulatory Visit

## 2013-07-11 ENCOUNTER — Ambulatory Visit: Payer: BC Managed Care – PPO | Admitting: Family Medicine

## 2013-07-14 ENCOUNTER — Ambulatory Visit (INDEPENDENT_AMBULATORY_CARE_PROVIDER_SITE_OTHER): Payer: Managed Care, Other (non HMO) | Admitting: Family Medicine

## 2013-07-14 ENCOUNTER — Encounter: Payer: Self-pay | Admitting: Family Medicine

## 2013-07-14 VITALS — BP 118/82 | HR 63 | Temp 98.3°F | Ht 63.0 in | Wt 144.0 lb

## 2013-07-14 DIAGNOSIS — B019 Varicella without complication: Secondary | ICD-10-CM | POA: Insufficient documentation

## 2013-07-14 DIAGNOSIS — Z0000 Encounter for general adult medical examination without abnormal findings: Secondary | ICD-10-CM

## 2013-07-14 DIAGNOSIS — T7840XA Allergy, unspecified, initial encounter: Secondary | ICD-10-CM | POA: Insufficient documentation

## 2013-07-14 DIAGNOSIS — D509 Iron deficiency anemia, unspecified: Secondary | ICD-10-CM

## 2013-07-14 HISTORY — DX: Iron deficiency anemia, unspecified: D50.9

## 2013-07-14 NOTE — Patient Instructions (Signed)
Preventive Care for Adults A healthy lifestyle and preventive care can promote health and wellness. Preventive health guidelines for women include the following key practices.  A routine yearly physical is a good way to check with your health care Katelyn [name] about your health and preventive screening. It is a chance to share any concerns and updates on your health and to receive a thorough exam.  Visit your dentist for a routine exam and preventive care every 6 months. Brush your teeth twice a day and floss once a day. Good oral hygiene prevents tooth decay and gum disease.  The frequency of eye exams is based on your age, health, family medical history, use of contact lenses, and other factors. Follow your health care Katelyn [name]'s recommendations for frequency of eye exams.  Eat a healthy diet. Foods like vegetables, fruits, whole grains, low-fat dairy products, and lean protein foods contain the nutrients you need without too many calories. Decrease your intake of foods high in solid fats, added sugars, and salt. Eat the right amount of calories for you.Get information about a proper diet from your health care Katelyn [name], if necessary.  Regular physical exercise is one of the most important things you can do for your health. Most adults should get at least 150 minutes of moderate-intensity exercise (any activity that increases your heart rate and causes you to sweat) each week. In addition, most adults need muscle-strengthening exercises on 2 or more days a week.  Maintain a healthy weight. The body mass index (BMI) is a screening tool to identify possible weight problems. It provides an estimate of body fat based on height and weight. Your health care Katelyn [name] can find your BMI, and can help you achieve or maintain a healthy weight.For adults 20 years and older:  A BMI below 18.5 is considered underweight.  A BMI of 18.5 to 24.9 is normal.  A BMI of 25 to 29.9 is considered overweight.  A BMI of  30 and above is considered obese.  Maintain normal blood lipids and cholesterol levels by exercising and minimizing your intake of saturated fat. Eat a balanced diet with plenty of fruit and vegetables. Blood tests for lipids and cholesterol should begin at age 52 and be repeated every 5 years. If your lipid or cholesterol levels are high, you are over 50, or you are at high risk for heart disease, you may need your cholesterol levels checked more frequently.Ongoing high lipid and cholesterol levels should be treated with medicines if diet and exercise are not working.  If you smoke, find out from your health care Katelyn [name] how to quit. If you do not use tobacco, do not start.  Lung cancer screening is recommended for adults aged 37-80 years who are at high risk for developing lung cancer because of a history of smoking. A yearly low-dose CT scan of the lungs is recommended for people who have at least a 30-pack-year history of smoking and are a current smoker or have quit within the past 15 years. A pack year of smoking is smoking an average of 1 pack of cigarettes a day for 1 year (for example: 1 pack a day for 30 years or 2 packs a day for 15 years). Yearly screening should continue until the smoker has stopped smoking for at least 15 years. Yearly screening should be stopped for people who develop a health problem that would prevent them from having lung cancer treatment.  If you are pregnant, do not drink alcohol. If you are breastfeeding,  be very cautious about drinking alcohol. If you are not pregnant and choose to drink alcohol, do not have more than 1 drink per day. One drink is considered to be 12 ounces (355 mL) of beer, 5 ounces (148 mL) of wine, or 1.5 ounces (44 mL) of liquor.  Avoid use of street drugs. Do not share needles with anyone. Ask for help if you need support or instructions about stopping the use of drugs.  High blood pressure causes heart disease and increases the risk of  stroke. Your blood pressure should be checked at least every 1 to 2 years. Ongoing high blood pressure should be treated with medicines if weight loss and exercise do not work.  If you are 75-52 years old, ask your health care Katelyn [name] if you should take aspirin to prevent strokes.  Diabetes screening involves taking a blood sample to check your fasting blood sugar level. This should be done once every 3 years, after age 15, if you are within normal weight and without risk factors for diabetes. Testing should be considered at a younger age or be carried out more frequently if you are overweight and have at least 1 risk factor for diabetes.  Breast cancer screening is essential preventive care for women. You should practice "breast self-awareness." This means understanding the normal appearance and feel of your breasts and may include breast self-examination. Any changes detected, no matter how small, should be reported to a health care Katelyn [name]. Women in their 58s and 30s should have a clinical breast exam (CBE) by a health care Katelyn [name] as part of a regular health exam every 1 to 3 years. After age 16, women should have a CBE every year. Starting at age 53, women should consider having a mammogram (breast X-ray test) every year. Women who have a family history of breast cancer should talk to their health care Katelyn [name] about genetic screening. Women at a high risk of breast cancer should talk to their health care providers about having an MRI and a mammogram every year.  Breast cancer gene (BRCA)-related cancer risk assessment is recommended for women who have family members with BRCA-related cancers. BRCA-related cancers include breast, ovarian, tubal, and peritoneal cancers. Having family members with these cancers may be associated with an increased risk for harmful changes (mutations) in the breast cancer genes BRCA1 and BRCA2. Results of the assessment will determine the need for genetic counseling and  BRCA1 and BRCA2 testing.  Routine pelvic exams to screen for cancer are no longer recommended for nonpregnant women who are considered low risk for cancer of the pelvic organs (ovaries, uterus, and vagina) and who do not have symptoms. Ask your health care Katelyn [name] if a screening pelvic exam is right for you.  If you have had past treatment for cervical cancer or a condition that could lead to cancer, you need Pap tests and screening for cancer for at least 20 years after your treatment. If Pap tests have been discontinued, your risk factors (such as having a new sexual partner) need to be reassessed to determine if screening should be resumed. Some women have medical problems that increase the chance of getting cervical cancer. In these cases, your health care Katelyn [name] may recommend more frequent screening and Pap tests.  The HPV test is an additional test that may be used for cervical cancer screening. The HPV test looks for the virus that can cause the cell changes on the cervix. The cells collected during the Pap test can be  tested for HPV. The HPV test could be used to screen women aged 47 years and older, and should be used in women of any age who have unclear Pap test results. After the age of 36, women should have HPV testing at the same frequency as a Pap test.  Colorectal cancer can be detected and often prevented. Most routine colorectal cancer screening begins at the age of 38 years and continues through age 58 years. However, your health care Katelyn [name] may recommend screening at an earlier age if you have risk factors for colon cancer. On a yearly basis, your health care Katelyn [name] may provide home test kits to check for hidden blood in the stool. Use of a small camera at the end of a tube, to directly examine the colon (sigmoidoscopy or colonoscopy), can detect the earliest forms of colorectal cancer. Talk to your health care Katelyn [name] about this at age 64, when routine screening begins. Direct  exam of the colon should be repeated every 5-10 years through age 21 years, unless early forms of pre-cancerous polyps or small growths are found.  People who are at an increased risk for hepatitis B should be screened for this virus. You are considered at high risk for hepatitis B if:  You were born in a country where hepatitis B occurs often. Talk with your health care Katelyn [name] about which countries are considered high risk.  Your parents were born in a high-risk country and you have not received a shot to protect against hepatitis B (hepatitis B vaccine).  You have HIV or AIDS.  You use needles to inject street drugs.  You live with, or have sex with, someone who has Hepatitis B.  You get hemodialysis treatment.  You take certain medicines for conditions like cancer, organ transplantation, and autoimmune conditions.  Hepatitis C blood testing is recommended for all people born from [birth year] through 1965 and any individual with known risks for hepatitis C.  Practice safe sex. Use condoms and avoid high-risk sexual practices to reduce the spread of sexually transmitted infections (STIs). STIs include gonorrhea, chlamydia, syphilis, trichomonas, herpes, HPV, and human immunodeficiency virus (HIV). Herpes, HIV, and HPV are viral illnesses that have no cure. They can result in disability, cancer, and death.  You should be screened for sexually transmitted illnesses (STIs) including gonorrhea and chlamydia if:  You are sexually active and are younger than 24 years.  You are older than 24 years and your health care [place] tells you that you are at risk for this type of infection.  Your sexual activity has changed since you were last screened and you are at an increased risk for chlamydia or gonorrhea. Ask your health care Katelyn [name] if you are at risk.  If you are at risk of being infected with HIV, it is recommended that you take a prescription medicine daily to prevent HIV infection. This is  called preexposure prophylaxis (PrEP). You are considered at risk if:  You are a heterosexual woman, are sexually active, and are at increased risk for HIV infection.  You take drugs by injection.  You are sexually active with a partner who has HIV.  Talk with your health care Katelyn [name] about whether you are at high risk of being infected with HIV. If you choose to begin PrEP, you should first be tested for HIV. You should then be tested every 3 months for as long as you are taking PrEP.  Osteoporosis is a disease in which the bones lose minerals and strength  with aging. This can result in serious bone fractures or breaks. The risk of osteoporosis can be identified using a bone density scan. Women ages 65 years and over and women at risk for fractures or osteoporosis should discuss screening with their health care providers. Ask your health care Katelyn [name] whether you should take a calcium supplement or vitamin D to reduce the rate of osteoporosis.  Menopause can be associated with physical symptoms and risks. Hormone replacement therapy is available to decrease symptoms and risks. You should talk to your health care Katelyn [name] about whether hormone replacement therapy is right for you.  Use sunscreen. Apply sunscreen liberally and repeatedly throughout the day. You should seek shade when your shadow is shorter than you. Protect yourself by wearing long sleeves, pants, a wide-brimmed hat, and sunglasses year round, whenever you are outdoors.  Once a month, do a whole body skin exam, using a mirror to look at the skin on your back. Tell your health care Romesha Scherer of new moles, moles that have irregular borders, moles that are larger than a pencil eraser, or moles that have changed in shape or color.  Stay current with required vaccines (immunizations).  Influenza vaccine. All adults should be immunized every year.  Tetanus, diphtheria, and acellular pertussis (Td, Tdap) vaccine. Pregnant women should  receive 1 dose of Tdap vaccine during each pregnancy. The dose should be obtained regardless of the length of time since the last dose. Immunization is preferred during the 27th-36th week of gestation. An adult who has not previously received Tdap or who does not know her vaccine status should receive 1 dose of Tdap. This initial dose should be followed by tetanus and diphtheria toxoids (Td) booster doses every 10 years. Adults with an unknown or incomplete history of completing a 3-dose immunization series with Td-containing vaccines should begin or complete a primary immunization series including a Tdap dose. Adults should receive a Td booster every 10 years.  Varicella vaccine. An adult without evidence of immunity to varicella should receive 2 doses or a second dose if she has previously received 1 dose. Pregnant females who do not have evidence of immunity should receive the first dose after pregnancy. This first dose should be obtained before leaving the health care facility. The second dose should be obtained 4-8 weeks after the first dose.  Human papillomavirus (HPV) vaccine. Females aged 13-26 years who have not received the vaccine previously should obtain the 3-dose series. The vaccine is not recommended for use in pregnant females. However, pregnancy testing is not needed before receiving a dose. If a female is found to be pregnant after receiving a dose, no treatment is needed. In that case, the remaining doses should be delayed until after the pregnancy. Immunization is recommended for any person with an immunocompromised condition through the age of 26 years if she did not get any or all doses earlier. During the 3-dose series, the second dose should be obtained 4-8 weeks after the first dose. The third dose should be obtained 24 weeks after the first dose and 16 weeks after the second dose.  Zoster vaccine. One dose is recommended for adults aged 60 years or older unless certain conditions are  present.  Measles, mumps, and rubella (MMR) vaccine. Adults born before 1957 generally are considered immune to measles and mumps. Adults born in 1957 or later should have 1 or more doses of MMR vaccine unless there is a contraindication to the vaccine or there is laboratory evidence of immunity to   each of the three diseases. A routine second dose of MMR vaccine should be obtained at least 28 days after the first dose for students attending postsecondary schools, health care workers, or international travelers. People who received inactivated measles vaccine or an unknown type of measles vaccine during 1963-1967 should receive 2 doses of MMR vaccine. People who received inactivated mumps vaccine or an unknown type of mumps vaccine before 1979 and are at high risk for mumps infection should consider immunization with 2 doses of MMR vaccine. For females of childbearing age, rubella immunity should be determined. If there is no evidence of immunity, females who are not pregnant should be vaccinated. If there is no evidence of immunity, females who are pregnant should delay immunization until after pregnancy. Unvaccinated health care workers born before 1957 who lack laboratory evidence of measles, mumps, or rubella immunity or laboratory confirmation of disease should consider measles and mumps immunization with 2 doses of MMR vaccine or rubella immunization with 1 dose of MMR vaccine.  Pneumococcal 13-valent conjugate (PCV13) vaccine. When indicated, a person who is uncertain of her immunization history and has no record of immunization should receive the PCV13 vaccine. An adult aged 19 years or older who has certain medical conditions and has not been previously immunized should receive 1 dose of PCV13 vaccine. This PCV13 should be followed with a dose of pneumococcal polysaccharide (PPSV23) vaccine. The PPSV23 vaccine dose should be obtained at least 8 weeks after the dose of PCV13 vaccine. An adult aged 19  years or older who has certain medical conditions and previously received 1 or more doses of PPSV23 vaccine should receive 1 dose of PCV13. The PCV13 vaccine dose should be obtained 1 or more years after the last PPSV23 vaccine dose.  Pneumococcal polysaccharide (PPSV23) vaccine. When PCV13 is also indicated, PCV13 should be obtained first. All adults aged 65 years and older should be immunized. An adult younger than age 65 years who has certain medical conditions should be immunized. Any person who resides in a nursing home or long-term care facility should be immunized. An adult smoker should be immunized. People with an immunocompromised condition and certain other conditions should receive both PCV13 and PPSV23 vaccines. People with human immunodeficiency virus (HIV) infection should be immunized as soon as possible after diagnosis. Immunization during chemotherapy or radiation therapy should be avoided. Routine use of PPSV23 vaccine is not recommended for American Indians, [place] Natives, or people younger than 65 years unless there are medical conditions that require PPSV23 vaccine. When indicated, people who have unknown immunization and have no record of immunization should receive PPSV23 vaccine. One-time revaccination 5 years after the first dose of PPSV23 is recommended for people aged 19-64 years who have chronic kidney failure, nephrotic syndrome, asplenia, or immunocompromised conditions. People who received 1-2 doses of PPSV23 before age 65 years should receive another dose of PPSV23 vaccine at age 65 years or later if at least 5 years have passed since the previous dose. Doses of PPSV23 are not needed for people immunized with PPSV23 at or after age 65 years.  Meningococcal vaccine. Adults with asplenia or persistent complement component deficiencies should receive 2 doses of quadrivalent meningococcal conjugate (MenACWY-D) vaccine. The doses should be obtained at least 2 months apart.  Microbiologists working with certain meningococcal bacteria, military recruits, people at risk during an outbreak, and people who travel to or live in countries with a high rate of meningitis should be immunized. A first-year college student up through age   21 years who is living in a residence hall should receive a dose if she did not receive a dose on or after her 16th birthday. Adults who have certain high-risk conditions should receive one or more doses of vaccine.  Hepatitis A vaccine. Adults who wish to be protected from this disease, have certain high-risk conditions, work with hepatitis A-infected animals, work in hepatitis A research labs, or travel to or work in countries with a high rate of hepatitis A should be immunized. Adults who were previously unvaccinated and who anticipate close contact with an international adoptee during the first 60 days after arrival in the [place] from a country with a high rate of hepatitis A should be immunized.  Hepatitis B vaccine. Adults who wish to be protected from this disease, have certain high-risk conditions, may be exposed to blood or other infectious body fluids, are household contacts or sex partners of hepatitis B positive people, are clients or workers in certain care facilities, or travel to or work in countries with a high rate of hepatitis B should be immunized.  Haemophilus influenzae type b (Hib) vaccine. A previously unvaccinated person with asplenia or sickle cell disease or having a scheduled splenectomy should receive 1 dose of Hib vaccine. Regardless of previous immunization, a recipient of a hematopoietic stem cell transplant should receive a 3-dose series 6-12 months after her successful transplant. Hib vaccine is not recommended for adults with HIV infection. Preventive Services / Frequency Ages 43 to 39years  Blood pressure check.** / Every 1 to 2 years.  Lipid and cholesterol check.** / Every 5 years beginning at age  75.  Clinical breast exam.** / Every 3 years for women in their 32s and 74s.  BRCA-related cancer risk assessment.** / For women who have family members with a BRCA-related cancer (breast, ovarian, tubal, or peritoneal cancers).  Pap test.** / Every 2 years from ages 65 through 91. Every 3 years starting at age 34 through age 93 or 72 with a history of 3 consecutive normal Pap tests.  HPV screening.** / Every 3 years from ages 46 through ages 53 to 26 with a history of 3 consecutive normal Pap tests.  Hepatitis C blood test.** / For any individual with known risks for hepatitis C.  Skin self-exam. / Monthly.  Influenza vaccine. / Every year.  Tetanus, diphtheria, and acellular pertussis (Tdap, Td) vaccine.** / Consult your health care Mallory Enriques. Pregnant women should receive 1 dose of Tdap vaccine during each pregnancy. 1 dose of Td every 10 years.  Varicella vaccine.** / Consult your health care Doaa Kendzierski. Pregnant females who do not have evidence of immunity should receive the first dose after pregnancy.  HPV vaccine. / 3 doses over 6 months, if 70 and younger. The vaccine is not recommended for use in pregnant females. However, pregnancy testing is not needed before receiving a dose.  Measles, mumps, rubella (MMR) vaccine.** / You need at least 1 dose of MMR if you were born in 1957 or later. You may also need a 2nd dose. For females of childbearing age, rubella immunity should be determined. If there is no evidence of immunity, females who are not pregnant should be vaccinated. If there is no evidence of immunity, females who are pregnant should delay immunization until after pregnancy.  Pneumococcal 13-valent conjugate (PCV13) vaccine.** / Consult your health care Aamya Orellana.  Pneumococcal polysaccharide (PPSV23) vaccine.** / 1 to 2 doses if you smoke cigarettes or if you have certain conditions.  Meningococcal vaccine.** /  1 dose if you are age 70 to 51 years and a [place] living in a residence hall, or have one of several medical conditions, you need to get vaccinated against meningococcal disease. You may also need additional booster doses.  Hepatitis A vaccine.** / Consult your health care Elmarie Devlin.  Hepatitis B vaccine.** / Consult your health care Artemis Koller.  Haemophilus influenzae type b (Hib) vaccine.** / Consult your health care Anaisha Mago. Ages 40 to 64years  Blood pressure check.** / Every 1 to 2 years.  Lipid and cholesterol check.** / Every 5 years beginning at age 58 years.  Lung cancer screening. / Every year if you are aged 56-80 years and have a 30-pack-year history of smoking and currently smoke or have quit within the past 15 years. Yearly screening is stopped once you have quit smoking for at least 15 years or develop a health problem that would prevent you from having lung cancer treatment.  Clinical breast exam.** / Every year after age 35 years.  BRCA-related cancer risk assessment.** / For women who have family members with a BRCA-related cancer (breast, ovarian, tubal, or peritoneal cancers).  Mammogram.** / Every year beginning at age 109 years and continuing for as long as you are in good health. Consult with your health care Ruey Storer.  Pap test.** / Every 3 years starting at age 44 years through age 94 or 70 years with a history of 3 consecutive normal Pap tests.  HPV screening.** / Every 3 years from ages 109 years through ages 50 to 30 years with a history of 3 consecutive normal Pap tests.  Fecal occult blood test (FOBT) of stool. / Every year beginning at age 73 years and continuing until age 59 years. You may not need to do this test if you get a colonoscopy every 10 years.  Flexible sigmoidoscopy or colonoscopy.** / Every 5 years for a flexible sigmoidoscopy or every 10 years for a colonoscopy beginning at age 68 years and continuing until age 12 years.  Hepatitis C blood test.** / For all people born from [birth year] through  1964 and any individual with known risks for hepatitis C.  Skin self-exam. / Monthly.  Influenza vaccine. / Every year.  Tetanus, diphtheria, and acellular pertussis (Tdap/Td) vaccine.** / Consult your health care Yazlynn Birkeland. Pregnant women should receive 1 dose of Tdap vaccine during each pregnancy. 1 dose of Td every 10 years.  Varicella vaccine.** / Consult your health care Zira Helinski. Pregnant females who do not have evidence of immunity should receive the first dose after pregnancy.  Zoster vaccine.** / 1 dose for adults aged 2 years or older.  Measles, mumps, rubella (MMR) vaccine.** / You need at least 1 dose of MMR if you were born in 1956 or later. You may also need a 2nd dose. For females of childbearing age, rubella immunity should be determined. If there is no evidence of immunity, females who are not pregnant should be vaccinated. If there is no evidence of immunity, females who are pregnant should delay immunization until after pregnancy.  Pneumococcal 13-valent conjugate (PCV13) vaccine.** / Consult your health care Capucine Tryon.  Pneumococcal polysaccharide (PPSV23) vaccine.** / 1 to 2 doses if you smoke cigarettes or if you have certain conditions.  Meningococcal vaccine.** / Consult your health care Rusell Meneely.  Hepatitis A vaccine.** / Consult your health care Sirius Woodford.  Hepatitis B vaccine.** / Consult your health care Zacheriah Stumpe.  Haemophilus influenzae type b (Hib) vaccine.** / Consult your health care Idris Edmundson. Ages 48 years  and over  Blood pressure check.** / Every 1 to 2 years.  Lipid and cholesterol check.** / Every 5 years beginning at age 84 years.  Lung cancer screening. / Every year if you are aged 50-80 years and have a 30-pack-year history of smoking and currently smoke or have quit within the past 15 years. Yearly screening is stopped once you have quit smoking for at least 15 years or develop a health problem that would prevent you from having lung cancer  treatment.  Clinical breast exam.** / Every year after age 24 years.  BRCA-related cancer risk assessment.** / For women who have family members with a BRCA-related cancer (breast, ovarian, tubal, or peritoneal cancers).  Mammogram.** / Every year beginning at age 14 years and continuing for as long as you are in good health. Consult with your health care Kathaleya Mcduffee.  Pap test.** / Every 3 years starting at age 17 years through age 31 or 74 years with 3 consecutive normal Pap tests. Testing can be stopped between 65 and 70 years with 3 consecutive normal Pap tests and no abnormal Pap or HPV tests in the past 10 years.  HPV screening.** / Every 3 years from ages 30 years through ages 70 or 28 years with a history of 3 consecutive normal Pap tests. Testing can be stopped between 65 and 70 years with 3 consecutive normal Pap tests and no abnormal Pap or HPV tests in the past 10 years.  Fecal occult blood test (FOBT) of stool. / Every year beginning at age 64 years and continuing until age 92 years. You may not need to do this test if you get a colonoscopy every 10 years.  Flexible sigmoidoscopy or colonoscopy.** / Every 5 years for a flexible sigmoidoscopy or every 10 years for a colonoscopy beginning at age 73 years and continuing until age 39 years.  Hepatitis C blood test.** / For all people born from [birth year] through 1964 and any individual with known risks for hepatitis C.  Osteoporosis screening.** / A one-time screening for women ages 35 years and over and women at risk for fractures or osteoporosis.  Skin self-exam. / Monthly.  Influenza vaccine. / Every year.  Tetanus, diphtheria, and acellular pertussis (Tdap/Td) vaccine.** / 1 dose of Td every 10 years.  Varicella vaccine.** / Consult your health care Jethro Radke.  Zoster vaccine.** / 1 dose for adults aged 59 years or older.  Pneumococcal 13-valent conjugate (PCV13) vaccine.** / Consult your health care Ciel Yanes.  Pneumococcal  polysaccharide (PPSV23) vaccine.** / 1 dose for all adults aged 8 years and older.  Meningococcal vaccine.** / Consult your health care Jozie Wulf.  Hepatitis A vaccine.** / Consult your health care Kai Railsback.  Hepatitis B vaccine.** / Consult your health care Melayna Robarts.  Haemophilus influenzae type b (Hib) vaccine.** / Consult your health care Zarah Carbon. ** Family history and personal history of risk and conditions may change your health care Davona Kinoshita's recommendations. Document Released: 02/28/2001 Document Revised: 01/07/2013 Document Reviewed: 05/30/2010 [facility] Patient Information 2015 [place], [place]. This information is not intended to replace advice given to you by your health care Rogue Pautler. Make sure you discuss any questions you have with your health care Cem Kosman.

## 2013-07-14 NOTE — Progress Notes (Signed)
Patient ID: Katelyn [name], female   DOB: 1975-01-27, 38 y.o.   MRN: [id number] Katelyn [name] [id number] 07/10/1975 07/14/2013      Progress Note-Follow Up  Subjective  Chief Complaint  Chief Complaint  Patient presents with  . Establish Care    new patient    HPI  Patient is a 38 year old female in today for routine medical care. Patient is in today to establish care. She's recently delivered her first care and is proceeding. Other than fatigue she is doing well. She works long shifts and previously has taken Advil with good results but is not taking it now due to pregnancy and presently. She denies any recent illness. No acute concerns. Denies CP/palp/SOB/HA/congestion/fevers/GI or GU c/o. Taking meds as prescribed  Past Medical History  Diagnosis Date  . Anemia   . GERD (gastroesophageal reflux disease)   . Chicken pox as a child  . Anemia, iron deficiency 07/14/2013    Past Surgical History  Procedure Laterality Date  . Myomectomy  2008  . Axillary cyst      left side  . Wisdom tooth extraction      Family History  Problem Relation Age of Onset  . Thyroid disease Mother   . Diabetes Maternal Grandfather     type 2  . Diabetes Paternal Grandmother     type 2  . Heart attack Paternal Grandfather     History   Social History  . Marital Status: Married    Spouse Name: N/A    Number of Children: N/A  . Years of Education: N/A   Occupational History  . Not on file.   Social History Main Topics  . Smoking status: Never Smoker   . Smokeless tobacco: Never Used  . Alcohol Use: No  . Drug Use: No  . Sexual Activity: Yes    Birth Control/ Protection: None     Comment: lives with husband and daughter, work at [employer]. No dietary restrictions, MD   Other Topics Concern  . Not on file   Social History Narrative  . No narrative on file    Current Outpatient Prescriptions on File Prior to Visit  Medication Sig Dispense Refill  . Prenatal Vit-Fe  Fumarate-FA (PRENATAL MULTIVITAMIN) TABS tablet Take 1 tablet by mouth daily at 12 noon.       No current facility-administered medications on file prior to visit.    No Known Allergies  Review of Systems  Review of Systems  Constitutional: Negative for fever, chills and malaise/fatigue.  HENT: Negative for congestion, hearing loss and nosebleeds.   Eyes: Negative for discharge.  Respiratory: Negative for cough, sputum production, shortness of breath and wheezing.   Cardiovascular: Negative for chest pain, palpitations and leg swelling.  Gastrointestinal: Negative for heartburn, nausea, vomiting, abdominal pain, diarrhea, constipation and blood in stool.  Genitourinary: Negative for dysuria, urgency, frequency and hematuria.  Musculoskeletal: Negative for back pain, falls and myalgias.  Skin: Negative for rash.  Neurological: Negative for dizziness, tremors, sensory change, focal weakness, loss of consciousness, weakness and headaches.  Endo/Heme/Allergies: Negative for polydipsia. Does not bruise/bleed easily.  Psychiatric/Behavioral: Negative for depression and suicidal ideas. The patient is not nervous/anxious and does not have insomnia.     Objective  BP 118/82  Pulse 63  Temp(Src) 98.3 F (36.8 C) (Oral)  Ht 5\' 3"  (1.6 m)  Wt 144 lb 0.6 oz (65.336 kg)  BMI 25.52 kg/m2  SpO2 97%  Breastfeeding? Yes  Physical Exam  Physical Exam  Constitutional: She is oriented to person, place, and time and well-developed, well-nourished, and in no distress. No distress.  HENT:  Head: Normocephalic and atraumatic.  Eyes: Conjunctivae are normal.  Neck: Neck supple. No thyromegaly present.  Cardiovascular: Normal rate, regular rhythm and normal heart sounds.   No murmur heard. Pulmonary/Chest: Effort normal and breath sounds normal. She has no wheezes.  Abdominal: She exhibits no distension and no mass.  Musculoskeletal: She exhibits no edema.  Lymphadenopathy:    She has no  cervical adenopathy.  Neurological: She is alert and oriented to person, place, and time.  Skin: Skin is warm and dry. No rash noted. She is not diaphoretic.  Psychiatric: Memory, affect and judgment normal.    No results found for this basename: TSH   Lab Results  Component Value Date   WBC 11.9* 06/26/2013   HGB 10.4* 06/26/2013   HCT 32.4* 06/26/2013   MCV 85.0 06/26/2013   PLT 149* 06/26/2013     Assessment & Plan  Preventative health care Patient encouraged to maintain heart healthy diet, regular exercise, adequate sleep. Consider daily probiotics. Take medications as prescribed  Anemia, iron deficiency S/p delivery, Increase leafy greens, consider increased lean red meat and using cast iron cookware. Continue to monitor, report any concerns

## 2013-07-14 NOTE — Progress Notes (Signed)
Pre visit review using our clinic review tool, if applicable. No additional management support is needed unless otherwise documented below in the visit note. 

## 2013-07-16 ENCOUNTER — Ambulatory Visit (HOSPITAL_COMMUNITY): Payer: Managed Care, Other (non HMO)

## 2013-07-20 ENCOUNTER — Encounter: Payer: Self-pay | Admitting: Family Medicine

## 2013-07-20 DIAGNOSIS — Z0000 Encounter for general adult medical examination without abnormal findings: Secondary | ICD-10-CM

## 2013-07-20 HISTORY — DX: Encounter for general adult medical examination without abnormal findings: Z00.00

## 2013-07-20 NOTE — Assessment & Plan Note (Signed)
Patient encouraged to maintain heart healthy diet, regular exercise, adequate sleep. Consider daily probiotics. Take medications as prescribed 

## 2013-07-20 NOTE — Assessment & Plan Note (Signed)
S/p delivery, Increase leafy greens, consider increased lean red meat and using cast iron cookware. Continue to monitor, report any concerns

## 2013-11-17 ENCOUNTER — Encounter: Payer: Self-pay | Admitting: Family Medicine

## 2014-07-06 ENCOUNTER — Encounter: Payer: Self-pay | Admitting: Physician Assistant

## 2014-07-06 ENCOUNTER — Telehealth: Payer: Self-pay | Admitting: Family Medicine

## 2014-07-06 ENCOUNTER — Ambulatory Visit (INDEPENDENT_AMBULATORY_CARE_PROVIDER_SITE_OTHER): Payer: Managed Care, Other (non HMO) | Admitting: Physician Assistant

## 2014-07-06 VITALS — BP 124/75 | HR 76 | Temp 98.3°F | Resp 16 | Ht 63.0 in | Wt 143.5 lb

## 2014-07-06 DIAGNOSIS — F909 Attention-deficit hyperactivity disorder, unspecified type: Secondary | ICD-10-CM

## 2014-07-06 DIAGNOSIS — F988 Other specified behavioral and emotional disorders with onset usually occurring in childhood and adolescence: Secondary | ICD-10-CM | POA: Insufficient documentation

## 2014-07-06 MED ORDER — LISDEXAMFETAMINE DIMESYLATE 40 MG PO CAPS: 40.0000 mg | ORAL_CAPSULE | ORAL | Status: DC

## 2014-07-06 NOTE — Patient Instructions (Signed)
Please take medication daily as directed. If you develop any tachycardia, jitteriness, insomnia or weight loss on medication, please let us know. Otherwise follow-up in 1 month.

## 2014-07-06 NOTE — Assessment & Plan Note (Signed)
Will restart Vyvanse 40 mg daily.  CSC signed.  Follow-up in 1 month.

## 2014-07-06 NOTE — Progress Notes (Signed)
Pre visit review using our clinic review tool, if applicable. No additional management support is needed unless otherwise documented below in the visit note/SLS  

## 2014-07-06 NOTE — Telephone Encounter (Signed)
Relation to pt: self  Call back number: [phone]   Reason for call:  Prior authorization needed for lisdexamfetamine (VYVANSE) 40 MG capsule   [employer] ID attached in documents)  Member ID # [id number] Group #[id number]

## 2014-07-06 NOTE — Progress Notes (Signed)
   Patient presents to clinic today to discuss restarting Vyvanse for her ADD.  Patient had been treated with 40 mg Vyvanse daily over the past several years but stopped due to pregnancy and breat feeding.  Is no longer breast feeding her daughter and would like to restart medication.  Patient first diagnosed with ADHD at age of 55 and has been medically treated ever since.  Endorses significant focus issues without her medication and does not want that to impact the quality of her work.  Past Medical History  Diagnosis Date  . Anemia   . GERD (gastroesophageal reflux disease)   . Chicken pox as a child  . Anemia, iron deficiency 07/14/2013  . Preventative health care 07/20/2013   No current outpatient prescriptions on file prior to visit.   No current facility-administered medications on file prior to visit.   No Known Allergies  Family History  Problem Relation Age of Onset  . Thyroid disease Mother   . Diabetes Maternal Grandfather     type 2  . Diabetes Paternal Grandmother     type 2  . Heart attack Paternal Grandfather     History   Social History  . Marital Status: Married    Spouse Name: N/A  . Number of Children: N/A  . Years of Education: N/A   Social History Main Topics  . Smoking status: Never Smoker   . Smokeless tobacco: Never Used  . Alcohol Use: No  . Drug Use: No  . Sexual Activity: Yes    Birth Control/ Protection: None     Comment: lives with husband and daughter, work at [employer]. No dietary restrictions, MD   Other Topics Concern  . None   Social History Narrative   Review of Systems - See HPI.  All other ROS are negative.  BP 124/75 mmHg  Pulse 76  Temp(Src) 98.3 F (36.8 C) (Oral)  Resp 16  Ht 5\' 3"  (1.6 m)  Wt 143 lb 8 oz (65.091 kg)  BMI 25.43 kg/m2  SpO2 100%  LMP 06/24/2014  Physical Exam  Constitutional: She is oriented to person, place, and time and well-developed, well-nourished, and in no distress.  HENT:  Head:  Normocephalic and atraumatic.  Cardiovascular: Normal rate, regular rhythm, normal heart sounds and intact distal pulses.   Pulmonary/Chest: Effort normal and breath sounds normal. No respiratory distress. She has no wheezes. She has no rales. She exhibits no tenderness.  Neurological: She is alert and oriented to person, place, and time.  Skin: Skin is warm and dry. No rash noted.  Psychiatric: Affect normal.  Vitals reviewed.  Assessment/Plan: ADD (attention deficit disorder) Will restart Vyvanse 40 mg daily.  CSC signed.  Follow-up in 1 month.

## 2014-07-10 NOTE — Telephone Encounter (Signed)
PA initiated through [employer]. Awaiting determination. JG//CMA

## 2014-07-13 ENCOUNTER — Telehealth: Payer: Self-pay | Admitting: *Deleted

## 2014-07-13 NOTE — Telephone Encounter (Signed)
Prior authorization for Vyvanse initiated. Awaiting determination. JG//CMA

## 2014-07-14 NOTE — Telephone Encounter (Signed)
Manus Gunning from [facility] called and is needing tried and failed medications that patient has taken  [phone]

## 2014-07-16 ENCOUNTER — Encounter: Payer: Managed Care, Other (non HMO) | Admitting: Family Medicine

## 2014-07-17 NOTE — Telephone Encounter (Signed)
Information provided as to failed medications. Awaiting determination. JG//CMA

## 2014-07-23 NOTE — Telephone Encounter (Signed)
PA is still pending.  

## 2014-07-28 NOTE — Telephone Encounter (Signed)
PA approved. JG//CMA effective 07/27/2014 through 07/27/2015. JG//CMA

## 2014-08-03 ENCOUNTER — Encounter: Payer: Self-pay | Admitting: Physician Assistant

## 2014-08-03 ENCOUNTER — Ambulatory Visit (INDEPENDENT_AMBULATORY_CARE_PROVIDER_SITE_OTHER): Payer: Managed Care, Other (non HMO) | Admitting: Physician Assistant

## 2014-08-03 VITALS — BP 120/80 | HR 83 | Temp 98.0°F | Ht 63.0 in | Wt 142.2 lb

## 2014-08-03 DIAGNOSIS — F909 Attention-deficit hyperactivity disorder, unspecified type: Secondary | ICD-10-CM

## 2014-08-03 DIAGNOSIS — F988 Other specified behavioral and emotional disorders with onset usually occurring in childhood and adolescence: Secondary | ICD-10-CM

## 2014-08-03 MED ORDER — LISDEXAMFETAMINE DIMESYLATE 50 MG PO CAPS: 50.0000 mg | ORAL_CAPSULE | Freq: Every day | ORAL | Status: DC

## 2014-08-03 NOTE — Patient Instructions (Signed)
Please start the new dose of Vyvanse daily as directed. Follow-up in 1 month for repeat assessment. If doing well, we will continue new dose and follow-up every 6 months.

## 2014-08-03 NOTE — Progress Notes (Signed)
Pre visit review using our clinic review tool, if applicable. No additional management support is needed unless otherwise documented below in the visit note. 

## 2014-08-03 NOTE — Progress Notes (Signed)
   Patient presents to clinic today for follow-up of ADD after being restarted on Vyvanse 40 mg daily. Endorses doing well overall. Notes better focus throughout her day, being able to complete work tasks more efficiently. Denies insomnia, palpitations or jitteriness with medication. Is still eating well. Patient is questioning if she would benefit more from a larger dose, giving her focus is improved but still has room for improvement.  Past Medical History  Diagnosis Date  . Anemia   . GERD (gastroesophageal reflux disease)   . Chicken pox as a child  . Anemia, iron deficiency 07/14/2013  . Preventative health care 07/20/2013    No current outpatient prescriptions on file prior to visit.   No current facility-administered medications on file prior to visit.    No Known Allergies  Family History  Problem Relation Age of Onset  . Thyroid disease Mother   . Diabetes Maternal Grandfather     type 2  . Diabetes Paternal Grandmother     type 2  . Heart attack Paternal Grandfather     History   Social History  . Marital Status: Married    Spouse Name: N/A  . Number of Children: N/A  . Years of Education: N/A   Social History Main Topics  . Smoking status: Never Smoker   . Smokeless tobacco: Never Used  . Alcohol Use: No  . Drug Use: No  . Sexual Activity: Yes    Birth Control/ Protection: None     Comment: lives with husband and daughter, work at [employer]. No dietary restrictions, MD   Other Topics Concern  . None   Social History Narrative   Review of Systems - See HPI.  All other ROS are negative.  BP 120/80 mmHg  Pulse 83  Temp(Src) 98 F (36.7 C) (Oral)  Ht 5\' 3"  (1.6 m)  Wt 142 lb 3.2 oz (64.501 kg)  BMI 25.20 kg/m2  SpO2 99%  LMP 06/24/2014  Breastfeeding? No  Physical Exam  Constitutional: She is oriented to person, place, and time and well-developed, well-nourished, and in no distress.  HENT:  Head: Normocephalic and atraumatic.  Eyes:  Conjunctivae are normal.  Cardiovascular: Normal rate, regular rhythm, normal heart sounds and intact distal pulses.   Pulmonary/Chest: Effort normal and breath sounds normal. No respiratory distress. She has no wheezes. She has no rales. She exhibits no tenderness.  Neurological: She is alert and oriented to person, place, and time.  Skin: Skin is warm and dry. No rash noted.  Psychiatric: Affect normal.   Assessment/Plan: ADD (attention deficit disorder) Improved. Vitals are stable. Will increase Vyvanse to 50 mg daily. Follow-up 1 month for reassessment with PCP.

## 2014-08-04 NOTE — Assessment & Plan Note (Signed)
Improved. Vitals are stable. Will increase Vyvanse to 50 mg daily. Follow-up 1 month for reassessment with PCP.

## 2014-08-05 IMAGING — RF DG HYSTEROGRAM
6 series · 6 of 6 positions shown · IV contrast (omnipaque)
Comparison: none

CLINICAL DATA: Infertility.  Prior myomectomy

HYSTEROSALPINGOGRAM
TECHNIQUE: Following cleansing of the cervix and vagina with
Betadine solution, a hysterosalpingogram was performed using a 5-
French hysterosalpingogram catheter and Omnipaque 300 contrast.
The patient tolerated the exam without difficulty.
Fluoroscopy time:  1-minute-3-seconds.

[Series 1: run · 1 of 1 slices shown (1 of 6)]
[im 1/1]
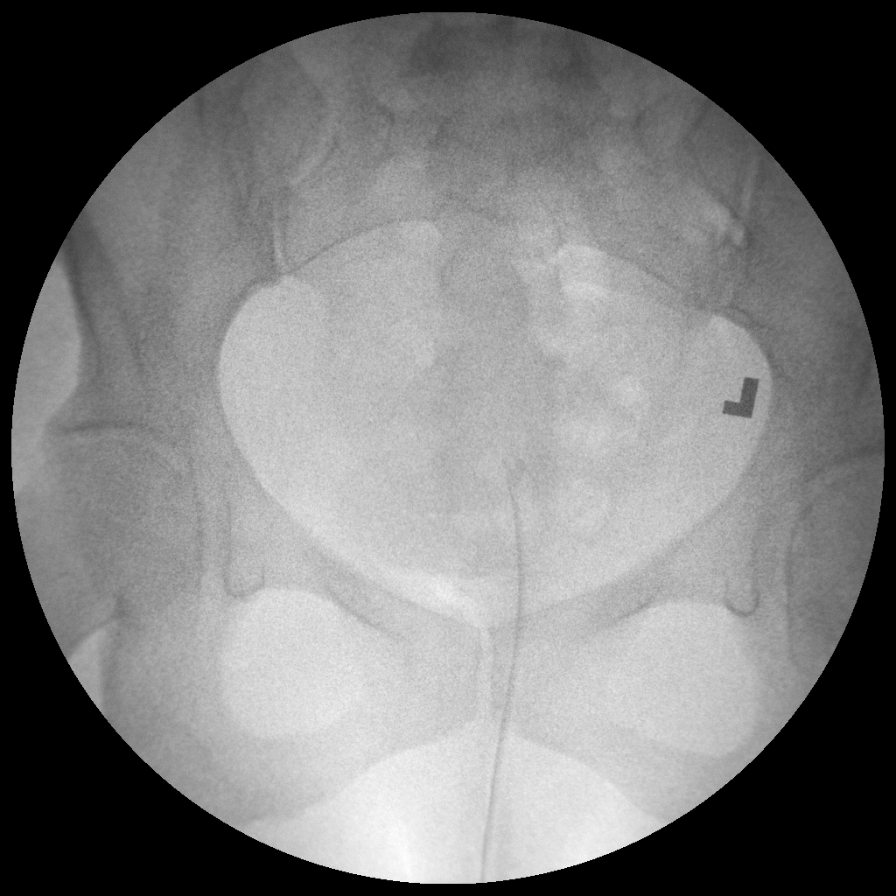

[Series 2: run · 1 of 1 slices shown (2 of 6)]
[im 1/1]
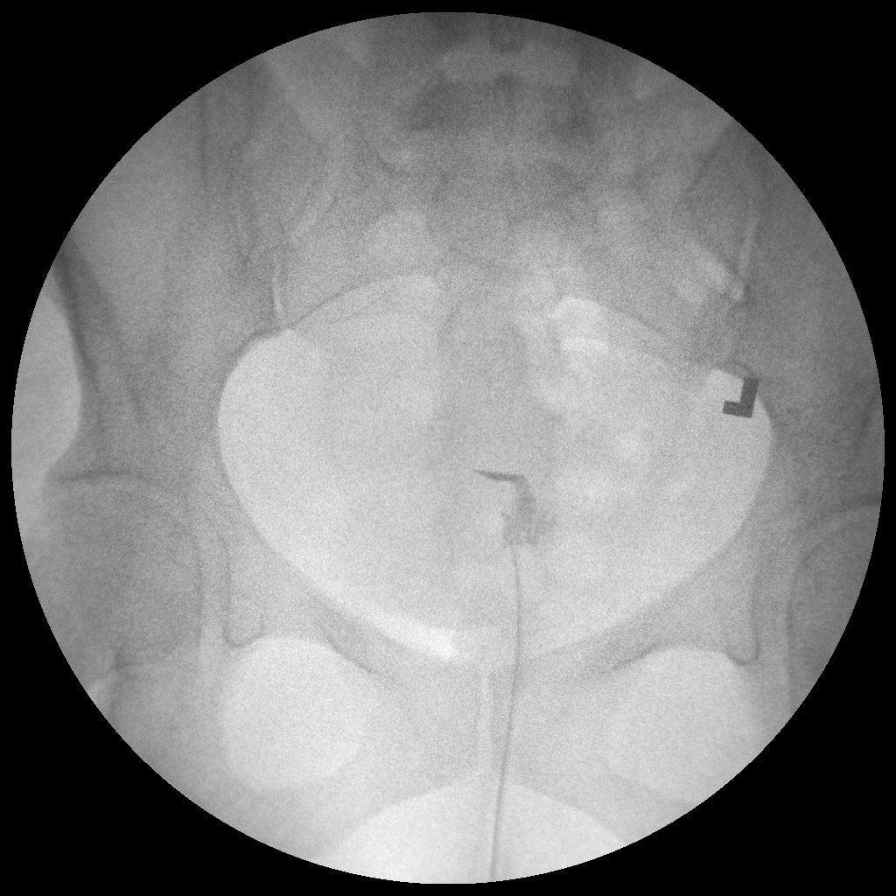

[Series 3: run · 1 of 1 slices shown (3 of 6)]
[im 1/1]
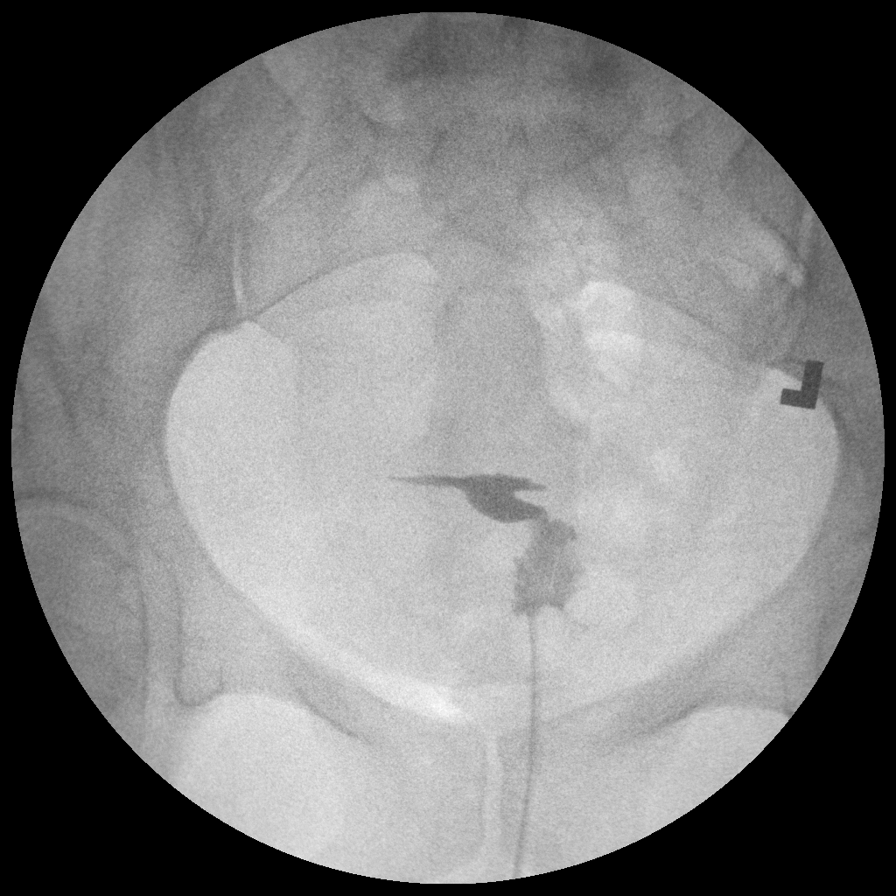

[Series 4: run · 1 of 1 slices shown (4 of 6)]
[im 1/1]
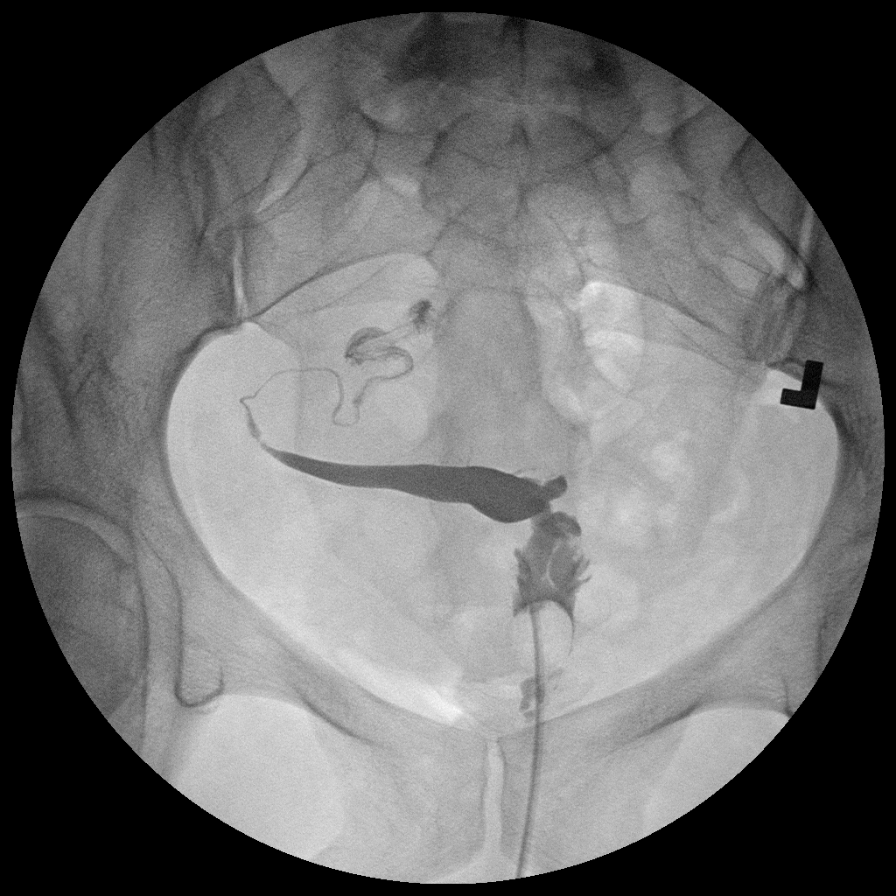

[Series 5: run · 1 of 1 slices shown (5 of 6)]
[im 1/1]
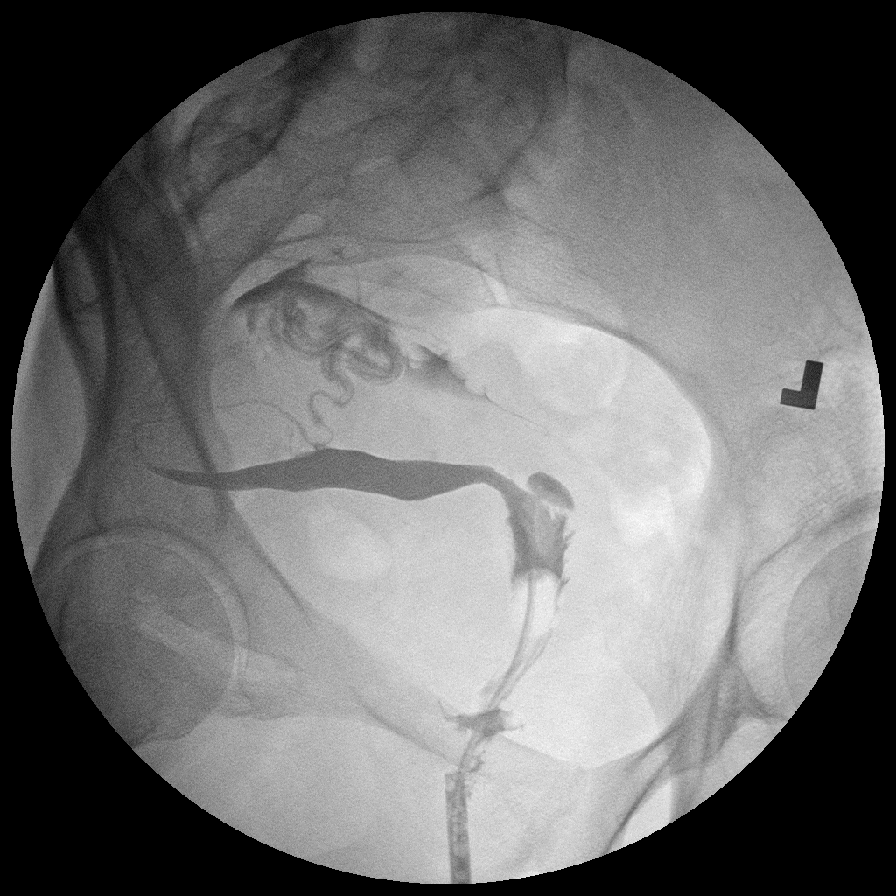

[Series 6: run · 1 of 1 slices shown (6 of 6)]
[im 1/1]
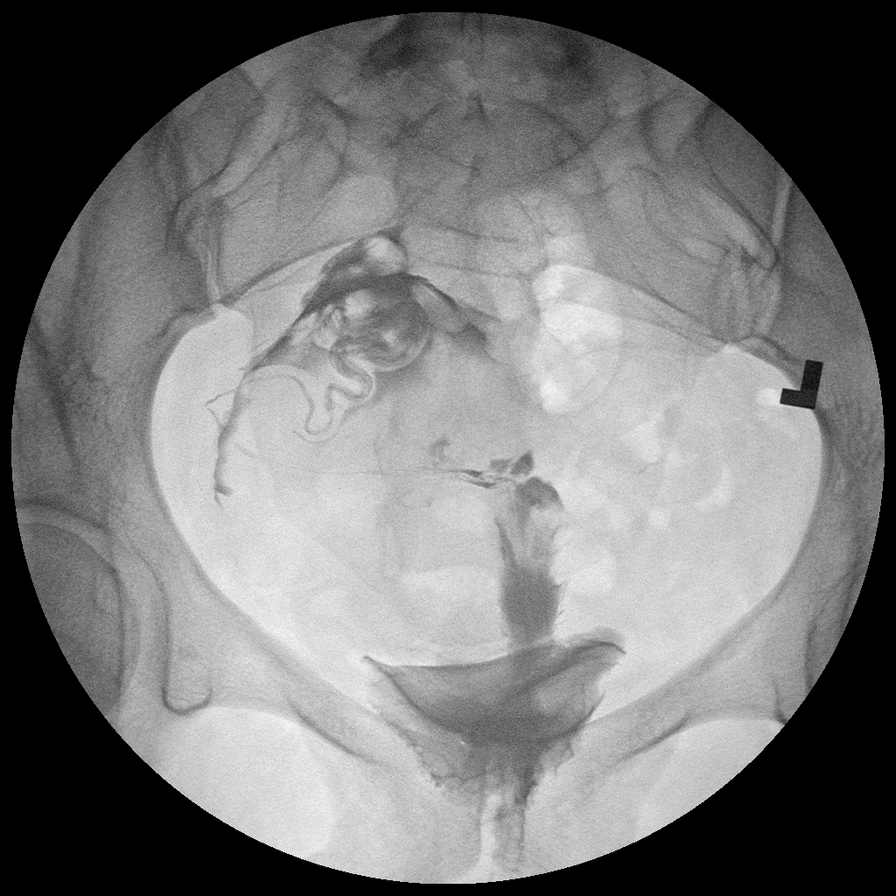

[6 of 6 positions shown; findings below may reference images not displayed]

FINDINGS: The endometrial cavity of the uterus is normal in contour
and appearance.

Contrast filling of the right fallopian tube is seen, and the right
fallopian tube is normal in appearance.  Intraperitoneal spill of
contrast from the right fallopian is demonstrated.  The left
fallopian tube is not visualized.  No left-sided intraperitoneal
spill is identified. Endometrial canal is within normal limits in
appearance.
IMPRESSION: Right-sided tubal patency with free intraperitoneal spill
identified on the right.  Left fallopian tube not visualized,
possibly due to spasm or occlusion.

## 2014-09-07 ENCOUNTER — Encounter: Payer: Self-pay | Admitting: Physician Assistant

## 2014-09-07 ENCOUNTER — Ambulatory Visit (INDEPENDENT_AMBULATORY_CARE_PROVIDER_SITE_OTHER): Payer: Managed Care, Other (non HMO) | Admitting: Physician Assistant

## 2014-09-07 VITALS — BP 128/96 | HR 86 | Temp 98.2°F | Resp 16 | Ht 63.0 in | Wt 141.0 lb

## 2014-09-07 DIAGNOSIS — F988 Other specified behavioral and emotional disorders with onset usually occurring in childhood and adolescence: Secondary | ICD-10-CM

## 2014-09-07 DIAGNOSIS — F909 Attention-deficit hyperactivity disorder, unspecified type: Secondary | ICD-10-CM | POA: Diagnosis not present

## 2014-09-07 MED ORDER — LISDEXAMFETAMINE DIMESYLATE 50 MG PO CAPS: 50.0000 mg | ORAL_CAPSULE | Freq: Every day | ORAL | Status: DC

## 2014-09-07 NOTE — Progress Notes (Signed)
   Patient presents to clinic today for follow-up of ADD after increasing Vyvanse to 50 mg daily. Endorses taking medication as directed. Endorses much better relief of symptoms. Denies palpitations, anorexia or insomnia.  Past Medical History  Diagnosis Date  . Anemia   . GERD (gastroesophageal reflux disease)   . Chicken pox as a child  . Anemia, iron deficiency 07/14/2013  . Preventative health care 07/20/2013    Current Outpatient Prescriptions on File Prior to Visit  Medication Sig Dispense Refill  . MARLISSA 0.15-30 MG-MCG tablet Take 1 tablet by mouth daily.  6   No current facility-administered medications on file prior to visit.    No Known Allergies  Family History  Problem Relation Age of Onset  . Thyroid disease Mother   . Diabetes Maternal Grandfather     type 2  . Diabetes Paternal Grandmother     type 2  . Heart attack Paternal Grandfather     Social History   Social History  . Marital Status: Married    Spouse Name: N/A  . Number of Children: N/A  . Years of Education: N/A   Social History Main Topics  . Smoking status: Never Smoker   . Smokeless tobacco: Never Used  . Alcohol Use: No  . Drug Use: No  . Sexual Activity: Yes    Birth Control/ Protection: None     Comment: lives with husband and daughter, work at [employer]. No dietary restrictions, MD   Other Topics Concern  . None   Social History Narrative   Review of Systems - See HPI.  All other ROS are negative.  BP 128/96 mmHg  Pulse 86  Temp(Src) 98.2 F (36.8 C) (Oral)  Resp 16  Ht 5\' 3"  (1.6 m)  Wt 141 lb (63.957 kg)  BMI 24.98 kg/m2  SpO2 99%  LMP 09/05/2014  Physical Exam  Constitutional: She is oriented to person, place, and time and well-developed, well-nourished, and in no distress.  HENT:  Head: Normocephalic and atraumatic.  Eyes: Conjunctivae are normal.  Pulmonary/Chest: Effort normal.  Neurological: She is alert and oriented to person, place, and time.  Skin:  Skin is warm and dry.  Psychiatric: Affect normal.  Vitals reviewed.   No results found for this or any previous visit (from the past 2160 hour(s)).  Assessment/Plan: ADD (attention deficit disorder) Well controlled. Continue current regimen. Medications refilled. Follow-up in 6 months.

## 2014-09-07 NOTE — Patient Instructions (Addendum)
Please continue current medication regimen. Follow-up in 6 months -- you can schedule with me or Dr. Charlett Blake. Return sooner if needed for sick visits.  I hope your husband's grandmother has a swift recovery.

## 2014-09-07 NOTE — Assessment & Plan Note (Signed)
Well controlled. Continue current regimen. Medications refilled. Follow-up in 6 months.

## 2014-09-07 NOTE — Progress Notes (Signed)
Pre visit review using our clinic review tool, if applicable. No additional management support is needed unless otherwise documented below in the visit note/SLS  

## 2014-12-02 ENCOUNTER — Other Ambulatory Visit: Payer: Self-pay | Admitting: Family Medicine

## 2014-12-02 NOTE — Telephone Encounter (Signed)
Caller name:Self   Can be reached: [phone]  Pharmacy: [facility], [place] [id number] (Phone) [phone] (Fax)         Reason for call: Request refill on lisdexamfetamine (VYVANSE) 50 MG capsule [id number]

## 2014-12-03 MED ORDER — LISDEXAMFETAMINE DIMESYLATE 50 MG PO CAPS: 50.0000 mg | ORAL_CAPSULE | Freq: Every day | ORAL | Status: DC

## 2014-12-04 NOTE — Telephone Encounter (Signed)
Called the patient to pickup hardcopy of vyvanse at the front desk.

## 2014-12-30 ENCOUNTER — Telehealth: Payer: Self-pay | Admitting: Behavioral Health

## 2014-12-30 NOTE — Telephone Encounter (Signed)
Unable to reach patient at time of Pre-Visit Call.  Left message for patient to return call when available.    

## 2014-12-31 ENCOUNTER — Encounter: Payer: Self-pay | Admitting: Family Medicine

## 2014-12-31 ENCOUNTER — Ambulatory Visit (INDEPENDENT_AMBULATORY_CARE_PROVIDER_SITE_OTHER): Payer: Managed Care, Other (non HMO) | Admitting: Family Medicine

## 2014-12-31 VITALS — BP 118/72 | HR 77 | Temp 97.9°F | Ht 63.0 in | Wt 146.1 lb

## 2014-12-31 DIAGNOSIS — J209 Acute bronchitis, unspecified: Secondary | ICD-10-CM | POA: Diagnosis not present

## 2014-12-31 DIAGNOSIS — D509 Iron deficiency anemia, unspecified: Secondary | ICD-10-CM

## 2014-12-31 DIAGNOSIS — F909 Attention-deficit hyperactivity disorder, unspecified type: Secondary | ICD-10-CM

## 2014-12-31 DIAGNOSIS — Z0000 Encounter for general adult medical examination without abnormal findings: Secondary | ICD-10-CM

## 2014-12-31 DIAGNOSIS — F988 Other specified behavioral and emotional disorders with onset usually occurring in childhood and adolescence: Secondary | ICD-10-CM

## 2014-12-31 LAB — COMPREHENSIVE METABOLIC PANEL
ALBUMIN: 4.1 g/dL (ref 3.5–5.2)
ALT: 23 U/L (ref 0–35)
AST: 19 U/L (ref 0–37)
Alkaline Phosphatase: 54 U/L (ref 39–117)
BUN: 14 mg/dL (ref 6–23)
CALCIUM: 9.1 mg/dL (ref 8.4–10.5)
CO2: 26 meq/L (ref 19–32)
Chloride: 106 mEq/L (ref 96–112)
Creatinine, Ser: 0.69 mg/dL (ref 0.40–1.20)
GFR: 100.88 mL/min (ref >60.00)
Glucose, Bld: 72 mg/dL (ref 70–99)
Potassium: 3.6 mEq/L (ref 3.5–5.1)
Sodium: 138 mEq/L (ref 135–145)
Total Bilirubin: 0.2 mg/dL (ref 0.2–1.2)
Total Protein: 7.4 g/dL (ref 6.0–8.3)

## 2014-12-31 LAB — LIPID PANEL
CHOLESTEROL: 174 mg/dL (ref 0–200)
HDL: 69.7 mg/dL (ref >39.00)
LDL Cholesterol: 78 mg/dL (ref 0–99)
NonHDL: 103.83
Total CHOL/HDL Ratio: 2
Triglycerides: 127 mg/dL (ref 0.0–149.0)
VLDL: 25.4 mg/dL (ref 0.0–40.0)

## 2014-12-31 LAB — CBC
HCT: 35.4 % — ABNORMAL LOW (ref 36.0–46.0)
HEMOGLOBIN: 11 g/dL — AB (ref 12.0–15.0)
MCHC: 31.2 g/dL (ref 30.0–36.0)
MCV: 72.9 fl — ABNORMAL LOW (ref 78.0–100.0)
PLATELETS: 395 10*3/uL (ref 150.0–400.0)
RBC: 4.86 Mil/uL (ref 3.87–5.11)
RDW: 16.8 % — ABNORMAL HIGH (ref 11.5–15.5)
WBC: 8.3 10*3/uL (ref 4.0–10.5)

## 2014-12-31 LAB — TSH: TSH: 3.14 u[IU]/mL (ref 0.35–4.50)

## 2014-12-31 MED ORDER — LISDEXAMFETAMINE DIMESYLATE 50 MG PO CAPS: 50.0000 mg | ORAL_CAPSULE | Freq: Every day | ORAL | Status: DC

## 2014-12-31 MED ORDER — AZITHROMYCIN 250 MG PO TABS: ORAL_TABLET | ORAL | Status: DC

## 2014-12-31 NOTE — Patient Instructions (Addendum)
Encouraged increased rest and hydration, add probiotics, zinc such as Coldeze or Xicam or plain Zinc 20-50 mg . Treat fevers as needed. Elderberry liquid as needed for sore throat and cough. Can order at [facility], [place]. 10 strain probiotic daily. Vitamin C 500 to 1000 mg daily. Echinacea and/or aged and/or black garlic  If no improvement then start Antibiotic   Preventive Care for Adults, Female A healthy lifestyle and preventive care can promote health and wellness. Preventive health guidelines for women include the following key practices.  A routine yearly physical is a good way to check with your health care provider about your health and preventive screening. It is a chance to share any concerns and updates on your health and to receive a thorough exam.  Visit your dentist for a routine exam and preventive care every 6 months. Brush your teeth twice a day and floss once a day. Good oral hygiene prevents tooth decay and gum disease.  The frequency of eye exams is based on your age, health, family medical history, use of contact lenses, and other factors. Follow your health care provider's recommendations for frequency of eye exams.  Eat a healthy diet. Foods like vegetables, fruits, whole grains, low-fat dairy products, and lean protein foods contain the nutrients you need without too many calories. Decrease your intake of foods high in solid fats, added sugars, and salt. Eat the right amount of calories for you.Get information about a proper diet from your health care provider, if necessary.  Regular physical exercise is one of the most important things you can do for your health. Most adults should get at least 150 minutes of moderate-intensity exercise (any activity that increases your heart rate and causes you to sweat) each week. In addition, most adults need muscle-strengthening exercises on 2 or more days a week.  Maintain a healthy weight. The body mass index (BMI) is  a screening tool to identify possible weight problems. It provides an estimate of body fat based on height and weight. Your health care provider can find your BMI and can help you achieve or maintain a healthy weight.For adults 20 years and older:  A BMI below 18.5 is considered underweight.  A BMI of 18.5 to 24.9 is normal.  A BMI of 25 to 29.9 is considered overweight.  A BMI of 30 and above is considered obese.  Maintain normal blood lipids and cholesterol levels by exercising and minimizing your intake of saturated fat. Eat a balanced diet with plenty of fruit and vegetables. Blood tests for lipids and cholesterol should begin at age 57 and be repeated every 5 years. If your lipid or cholesterol levels are high, you are over 50, or you are at high risk for heart disease, you may need your cholesterol levels checked more frequently.Ongoing high lipid and cholesterol levels should be treated with medicines if diet and exercise are not working.  If you smoke, find out from your health care provider how to quit. If you do not use tobacco, do not start.  Lung cancer screening is recommended for adults aged 57-80 years who are at high risk for developing lung cancer because of a history of smoking. A yearly low-dose CT scan of the lungs is recommended for people who have at least a 30-pack-year history of smoking and are a current smoker or have quit within the past 15 years. A pack year of smoking is smoking an average of 1 pack of cigarettes a day for 1 year (  for example: 1 pack a day for 30 years or 2 packs a day for 15 years). Yearly screening should continue until the smoker has stopped smoking for at least 15 years. Yearly screening should be stopped for people who develop a health problem that would prevent them from having lung cancer treatment.  If you are pregnant, do not drink alcohol. If you are breastfeeding, be very cautious about drinking alcohol. If you are not pregnant and choose to  drink alcohol, do not have more than 1 drink per day. One drink is considered to be 12 ounces (355 mL) of beer, 5 ounces (148 mL) of wine, or 1.5 ounces (44 mL) of liquor.  Avoid use of street drugs. Do not share needles with anyone. Ask for help if you need support or instructions about stopping the use of drugs.  High blood pressure causes heart disease and increases the risk of stroke. Your blood pressure should be checked at least every 1 to 2 years. Ongoing high blood pressure should be treated with medicines if weight loss and exercise do not work.  If you are 18-63 years old, ask your health care provider if you should take aspirin to prevent strokes.  Diabetes screening is done by taking a blood sample to check your blood glucose level after you have not eaten for a certain period of time (fasting). If you are not overweight and you do not have risk factors for diabetes, you should be screened once every 3 years starting at age 44. If you are overweight or obese and you are 25-24 years of age, you should be screened for diabetes every year as part of your cardiovascular risk assessment.  Breast cancer screening is essential preventive care for women. You should practice "breast self-awareness." This means understanding the normal appearance and feel of your breasts and may include breast self-examination. Any changes detected, no matter how small, should be reported to a health care provider. Women in their 69s and 30s should have a clinical breast exam (CBE) by a health care provider as part of a regular health exam every 1 to 3 years. After age 65, women should have a CBE every year. Starting at age 67, women should consider having a mammogram (breast X-ray test) every year. Women who have a family history of breast cancer should talk to their health care provider about genetic screening. Women at a high risk of breast cancer should talk to their health care providers about having an MRI and a  mammogram every year.  Breast cancer gene (BRCA)-related cancer risk assessment is recommended for women who have family members with BRCA-related cancers. BRCA-related cancers include breast, ovarian, tubal, and peritoneal cancers. Having family members with these cancers may be associated with an increased risk for harmful changes (mutations) in the breast cancer genes BRCA1 and BRCA2. Results of the assessment will determine the need for genetic counseling and BRCA1 and BRCA2 testing.  Your health care provider may recommend that you be screened regularly for cancer of the pelvic organs (ovaries, uterus, and vagina). This screening involves a pelvic examination, including checking for microscopic changes to the surface of your cervix (Pap test). You may be encouraged to have this screening done every 3 years, beginning at age 25.  For women ages 68-65, health care providers may recommend pelvic exams and Pap testing every 3 years, or they may recommend the Pap and pelvic exam, combined with testing for human papilloma virus (HPV), every 5 years. Some types  of HPV increase your risk of cervical cancer. Testing for HPV may also be done on women of any age with unclear Pap test results.  Other health care providers may not recommend any screening for nonpregnant women who are considered low risk for pelvic cancer and who do not have symptoms. Ask your health care provider if a screening pelvic exam is right for you.  If you have had past treatment for cervical cancer or a condition that could lead to cancer, you need Pap tests and screening for cancer for at least 20 years after your treatment. If Pap tests have been discontinued, your risk factors (such as having a new sexual partner) need to be reassessed to determine if screening should resume. Some women have medical problems that increase the chance of getting cervical cancer. In these cases, your health care provider may recommend more frequent  screening and Pap tests.  Colorectal cancer can be detected and often prevented. Most routine colorectal cancer screening begins at the age of 64 years and continues through age 12 years. However, your health care provider may recommend screening at an earlier age if you have risk factors for colon cancer. On a yearly basis, your health care provider may provide home test kits to check for hidden blood in the stool. Use of a small camera at the end of a tube, to directly examine the colon (sigmoidoscopy or colonoscopy), can detect the earliest forms of colorectal cancer. Talk to your health care provider about this at age 107, when routine screening begins. Direct exam of the colon should be repeated every 5-10 years through age 65 years, unless early forms of precancerous polyps or small growths are found.  People who are at an increased risk for hepatitis B should be screened for this virus. You are considered at high risk for hepatitis B if:  You were born in a country where hepatitis B occurs often. Talk with your health care provider about which countries are considered high risk.  Your parents were born in a high-risk country and you have not received a shot to protect against hepatitis B (hepatitis B vaccine).  You have HIV or AIDS.  You use needles to inject street drugs.  You live with, or have sex with, someone who has hepatitis B.  You get hemodialysis treatment.  You take certain medicines for conditions like cancer, organ transplantation, and autoimmune conditions.  Hepatitis C blood testing is recommended for all people born from [birth year] through 1965 and any individual with known risks for hepatitis C.  Practice safe sex. Use condoms and avoid high-risk sexual practices to reduce the spread of sexually transmitted infections (STIs). STIs include gonorrhea, chlamydia, syphilis, trichomonas, herpes, HPV, and human immunodeficiency virus (HIV). Herpes, HIV, and HPV are viral illnesses  that have no cure. They can result in disability, cancer, and death.  You should be screened for sexually transmitted illnesses (STIs) including gonorrhea and chlamydia if:  You are sexually active and are younger than 24 years.  You are older than 24 years and your health care provider tells you that you are at risk for this type of infection.  Your sexual activity has changed since you were last screened and you are at an increased risk for chlamydia or gonorrhea. Ask your health care provider if you are at risk.  If you are at risk of being infected with HIV, it is recommended that you take a prescription medicine daily to prevent HIV infection. This is called  preexposure prophylaxis (PrEP). You are considered at risk if:  You are sexually active and do not regularly use condoms or know the HIV status of your partner(s).  You take drugs by injection.  You are sexually active with a partner who has HIV.  Talk with your health care provider about whether you are at high risk of being infected with HIV. If you choose to begin PrEP, you should first be tested for HIV. You should then be tested every 3 months for as long as you are taking PrEP.  Osteoporosis is a disease in which the bones lose minerals and strength with aging. This can result in serious bone fractures or breaks. The risk of osteoporosis can be identified using a bone density scan. Women ages 73 years and over and women at risk for fractures or osteoporosis should discuss screening with their health care providers. Ask your health care provider whether you should take a calcium supplement or vitamin D to reduce the rate of osteoporosis.  Menopause can be associated with physical symptoms and risks. Hormone replacement therapy is available to decrease symptoms and risks. You should talk to your health care provider about whether hormone replacement therapy is right for you.  Use sunscreen. Apply sunscreen liberally and  repeatedly throughout the day. You should seek shade when your shadow is shorter than you. Protect yourself by wearing long sleeves, pants, a wide-brimmed hat, and sunglasses year round, whenever you are outdoors.  Once a month, do a whole body skin exam, using a mirror to look at the skin on your back. Tell your health care provider of new moles, moles that have irregular borders, moles that are larger than a pencil eraser, or moles that have changed in shape or color.  Stay current with required vaccines (immunizations).  Influenza vaccine. All adults should be immunized every year.  Tetanus, diphtheria, and acellular pertussis (Td, Tdap) vaccine. Pregnant women should receive 1 dose of Tdap vaccine during each pregnancy. The dose should be obtained regardless of the length of time since the last dose. Immunization is preferred during the 27th-36th week of gestation. An adult who has not previously received Tdap or who does not know her vaccine status should receive 1 dose of Tdap. This initial dose should be followed by tetanus and diphtheria toxoids (Td) booster doses every 10 years. Adults with an unknown or incomplete history of completing a 3-dose immunization series with Td-containing vaccines should begin or complete a primary immunization series including a Tdap dose. Adults should receive a Td booster every 10 years.  Varicella vaccine. An adult without evidence of immunity to varicella should receive 2 doses or a second dose if she has previously received 1 dose. Pregnant females who do not have evidence of immunity should receive the first dose after pregnancy. This first dose should be obtained before leaving the health care facility. The second dose should be obtained 4-8 weeks after the first dose.  Human papillomavirus (HPV) vaccine. Females aged 13-26 years who have not received the vaccine previously should obtain the 3-dose series. The vaccine is not recommended for use in pregnant  females. However, pregnancy testing is not needed before receiving a dose. If a female is found to be pregnant after receiving a dose, no treatment is needed. In that case, the remaining doses should be delayed until after the pregnancy. Immunization is recommended for any person with an immunocompromised condition through the age of 51 years if she did not get any or all  doses earlier. During the 3-dose series, the second dose should be obtained 4-8 weeks after the first dose. The third dose should be obtained 24 weeks after the first dose and 16 weeks after the second dose.  Zoster vaccine. One dose is recommended for adults aged 23 years or older unless certain conditions are present.  Measles, mumps, and rubella (MMR) vaccine. Adults born before [birth year] generally are considered immune to measles and mumps. Adults born in [birth year] or later should have 1 or more doses of MMR vaccine unless there is a contraindication to the vaccine or there is laboratory evidence of immunity to each of the three diseases. A routine second dose of MMR vaccine should be obtained at least 28 days after the first dose for students attending postsecondary schools, health care workers, or international travelers. People who received inactivated measles vaccine or an unknown type of measles vaccine during 1963-1967 should receive 2 doses of MMR vaccine. People who received inactivated mumps vaccine or an unknown type of mumps vaccine before 1979 and are at high risk for mumps infection should consider immunization with 2 doses of MMR vaccine. For females of childbearing age, rubella immunity should be determined. If there is no evidence of immunity, females who are not pregnant should be vaccinated. If there is no evidence of immunity, females who are pregnant should delay immunization until after pregnancy. Unvaccinated health care workers born before [birth year] who lack laboratory evidence of measles, mumps, or rubella immunity or laboratory  confirmation of disease should consider measles and mumps immunization with 2 doses of MMR vaccine or rubella immunization with 1 dose of MMR vaccine.  Pneumococcal 13-valent conjugate (PCV13) vaccine. When indicated, a person who is uncertain of his immunization history and has no record of immunization should receive the PCV13 vaccine. All adults 23 years of age and older should receive this vaccine. An adult aged 74 years or older who has certain medical conditions and has not been previously immunized should receive 1 dose of PCV13 vaccine. This PCV13 should be followed with a dose of pneumococcal polysaccharide (PPSV23) vaccine. Adults who are at high risk for pneumococcal disease should obtain the PPSV23 vaccine at least 8 weeks after the dose of PCV13 vaccine. Adults older than 38 years of age who have normal immune system function should obtain the PPSV23 vaccine dose at least 1 year after the dose of PCV13 vaccine.  Pneumococcal polysaccharide (PPSV23) vaccine. When PCV13 is also indicated, PCV13 should be obtained first. All adults aged 19 years and older should be immunized. An adult younger than age 67 years who has certain medical conditions should be immunized. Any person who resides in a nursing home or long-term care facility should be immunized. An adult smoker should be immunized. People with an immunocompromised condition and certain other conditions should receive both PCV13 and PPSV23 vaccines. People with human immunodeficiency virus (HIV) infection should be immunized as soon as possible after diagnosis. Immunization during chemotherapy or radiation therapy should be avoided. Routine use of PPSV23 vaccine is not recommended for American Indians, [place] Natives, or people younger than 65 years unless there are medical conditions that require PPSV23 vaccine. When indicated, people who have unknown immunization and have no record of immunization should receive PPSV23 vaccine. One-time  revaccination 5 years after the first dose of PPSV23 is recommended for people aged 19-64 years who have chronic kidney failure, nephrotic syndrome, asplenia, or immunocompromised conditions. People who received 1-2 doses of PPSV23 before age 3 years should receive  another dose of PPSV23 vaccine at age 70 years or later if at least 5 years have passed since the previous dose. Doses of PPSV23 are not needed for people immunized with PPSV23 at or after age 62 years.  Meningococcal vaccine. Adults with asplenia or persistent complement component deficiencies should receive 2 doses of quadrivalent meningococcal conjugate (MenACWY-D) vaccine. The doses should be obtained at least 2 months apart. Microbiologists working with certain meningococcal bacteria, [place] recruits, people at risk during an outbreak, and people who travel to or live in countries with a high rate of meningitis should be immunized. A first-year college student up through age 17 years who is living in a residence hall should receive a dose if she did not receive a dose on or after her 16th birthday. Adults who have certain high-risk conditions should receive one or more doses of vaccine.  Hepatitis A vaccine. Adults who wish to be protected from this disease, have certain high-risk conditions, work with hepatitis A-infected animals, work in hepatitis A research labs, or travel to or work in countries with a high rate of hepatitis A should be immunized. Adults who were previously unvaccinated and who anticipate close contact with an international adoptee during the first 60 days after arrival in the [place] from a country with a high rate of hepatitis A should be immunized.  Hepatitis B vaccine. Adults who wish to be protected from this disease, have certain high-risk conditions, may be exposed to blood or other infectious body fluids, are household contacts or sex partners of hepatitis B positive people, are clients or workers in  certain care facilities, or travel to or work in countries with a high rate of hepatitis B should be immunized.  Haemophilus influenzae type b (Hib) vaccine. A previously unvaccinated person with asplenia or sickle cell disease or having a scheduled splenectomy should receive 1 dose of Hib vaccine. Regardless of previous immunization, a recipient of a hematopoietic stem cell transplant should receive a 3-dose series 6-12 months after her successful transplant. Hib vaccine is not recommended for adults with HIV infection. Preventive Services / Frequency Ages 1 to 21 years  Blood pressure check.** / Every 3-5 years.  Lipid and cholesterol check.** / Every 5 years beginning at age 81.  Clinical breast exam.** / Every 3 years for women in their 46s and 67s.  BRCA-related cancer risk assessment.** / For women who have family members with a BRCA-related cancer (breast, ovarian, tubal, or peritoneal cancers).  Pap test.** / Every 2 years from ages 69 through 42. Every 3 years starting at age 29 through age 48 or 42 with a history of 3 consecutive normal Pap tests.  HPV screening.** / Every 3 years from ages 26 through ages 65 to 47 with a history of 3 consecutive normal Pap tests.  Hepatitis C blood test.** / For any individual with known risks for hepatitis C.  Skin self-exam. / Monthly.  Influenza vaccine. / Every year.  Tetanus, diphtheria, and acellular pertussis (Tdap, Td) vaccine.** / Consult your health care provider. Pregnant women should receive 1 dose of Tdap vaccine during each pregnancy. 1 dose of Td every 10 years.  Varicella vaccine.** / Consult your health care provider. Pregnant females who do not have evidence of immunity should receive the first dose after pregnancy.  HPV vaccine. / 3 doses over 6 months, if 17 and younger. The vaccine is not recommended for use in pregnant females. However, pregnancy testing is not needed before receiving a dose.  Measles, mumps, rubella  (MMR) vaccine.** / You need at least 1 dose of MMR if you were born in 1957 or later. You may also need a 2nd dose. For females of childbearing age, rubella immunity should be determined. If there is no evidence of immunity, females who are not pregnant should be vaccinated. If there is no evidence of immunity, females who are pregnant should delay immunization until after pregnancy.  Pneumococcal 13-valent conjugate (PCV13) vaccine.** / Consult your health care provider.  Pneumococcal polysaccharide (PPSV23) vaccine.** / 1 to 2 doses if you smoke cigarettes or if you have certain conditions.  Meningococcal vaccine.** / 1 dose if you are age 65 to 53 years and a Market researcher living in a residence hall, or have one of several medical conditions, you need to get vaccinated against meningococcal disease. You may also need additional booster doses.  Hepatitis A vaccine.** / Consult your health care provider.  Hepatitis B vaccine.** / Consult your health care provider.  Haemophilus influenzae type b (Hib) vaccine.** / Consult your health care provider. Ages 48 to 21 years  Blood pressure check.** / Every year.  Lipid and cholesterol check.** / Every 5 years beginning at age 55 years.  Lung cancer screening. / Every year if you are aged 56-80 years and have a 30-pack-year history of smoking and currently smoke or have quit within the past 15 years. Yearly screening is stopped once you have quit smoking for at least 15 years or develop a health problem that would prevent you from having lung cancer treatment.  Clinical breast exam.** / Every year after age 53 years.  BRCA-related cancer risk assessment.** / For women who have family members with a BRCA-related cancer (breast, ovarian, tubal, or peritoneal cancers).  Mammogram.** / Every year beginning at age 43 years and continuing for as long as you are in good health. Consult with your health care provider.  Pap test.** / Every 3  years starting at age 21 years through age 29 or 9 years with a history of 3 consecutive normal Pap tests.  HPV screening.** / Every 3 years from ages 58 years through ages 65 to 62 years with a history of 3 consecutive normal Pap tests.  Fecal occult blood test (FOBT) of stool. / Every year beginning at age 68 years and continuing until age 19 years. You may not need to do this test if you get a colonoscopy every 10 years.  Flexible sigmoidoscopy or colonoscopy.** / Every 5 years for a flexible sigmoidoscopy or every 10 years for a colonoscopy beginning at age 44 years and continuing until age 58 years.  Hepatitis C blood test.** / For all people born from [birth year] through 1964 and any individual with known risks for hepatitis C.  Skin self-exam. / Monthly.  Influenza vaccine. / Every year.  Tetanus, diphtheria, and acellular pertussis (Tdap/Td) vaccine.** / Consult your health care provider. Pregnant women should receive 1 dose of Tdap vaccine during each pregnancy. 1 dose of Td every 10 years.  Varicella vaccine.** / Consult your health care provider. Pregnant females who do not have evidence of immunity should receive the first dose after pregnancy.  Zoster vaccine.** / 1 dose for adults aged 34 years or older.  Measles, mumps, rubella (MMR) vaccine.** / You need at least 1 dose of MMR if you were born in 1957 or later. You may also need a second dose. For females of childbearing age, rubella immunity should be determined. If there is no  evidence of immunity, females who are not pregnant should be vaccinated. If there is no evidence of immunity, females who are pregnant should delay immunization until after pregnancy.  Pneumococcal 13-valent conjugate (PCV13) vaccine.** / Consult your health care provider.  Pneumococcal polysaccharide (PPSV23) vaccine.** / 1 to 2 doses if you smoke cigarettes or if you have certain conditions.  Meningococcal vaccine.** / Consult your health care  provider.  Hepatitis A vaccine.** / Consult your health care provider.  Hepatitis B vaccine.** / Consult your health care provider.  Haemophilus influenzae type b (Hib) vaccine.** / Consult your health care provider. Ages 15 years and over  Blood pressure check.** / Every year.  Lipid and cholesterol check.** / Every 5 years beginning at age 30 years.  Lung cancer screening. / Every year if you are aged 27-80 years and have a 30-pack-year history of smoking and currently smoke or have quit within the past 15 years. Yearly screening is stopped once you have quit smoking for at least 15 years or develop a health problem that would prevent you from having lung cancer treatment.  Clinical breast exam.** / Every year after age 74 years.  BRCA-related cancer risk assessment.** / For women who have family members with a BRCA-related cancer (breast, ovarian, tubal, or peritoneal cancers).  Mammogram.** / Every year beginning at age 7 years and continuing for as long as you are in good health. Consult with your health care provider.  Pap test.** / Every 3 years starting at age 46 years through age 34 or 57 years with 3 consecutive normal Pap tests. Testing can be stopped between 65 and 70 years with 3 consecutive normal Pap tests and no abnormal Pap or HPV tests in the past 10 years.  HPV screening.** / Every 3 years from ages 60 years through ages 76 or 25 years with a history of 3 consecutive normal Pap tests. Testing can be stopped between 65 and 70 years with 3 consecutive normal Pap tests and no abnormal Pap or HPV tests in the past 10 years.  Fecal occult blood test (FOBT) of stool. / Every year beginning at age 70 years and continuing until age 15 years. You may not need to do this test if you get a colonoscopy every 10 years.  Flexible sigmoidoscopy or colonoscopy.** / Every 5 years for a flexible sigmoidoscopy or every 10 years for a colonoscopy beginning at age 23 years and continuing  until age 56 years.  Hepatitis C blood test.** / For all people born from [birth year] through 1964 and any individual with known risks for hepatitis C.  Osteoporosis screening.** / A one-time screening for women ages 30 years and over and women at risk for fractures or osteoporosis.  Skin self-exam. / Monthly.  Influenza vaccine. / Every year.  Tetanus, diphtheria, and acellular pertussis (Tdap/Td) vaccine.** / 1 dose of Td every 10 years.  Varicella vaccine.** / Consult your health care provider.  Zoster vaccine.** / 1 dose for adults aged 107 years or older.  Pneumococcal 13-valent conjugate (PCV13) vaccine.** / Consult your health care provider.  Pneumococcal polysaccharide (PPSV23) vaccine.** / 1 dose for all adults aged 21 years and older.  Meningococcal vaccine.** / Consult your health care provider.  Hepatitis A vaccine.** / Consult your health care provider.  Hepatitis B vaccine.** / Consult your health care provider.  Haemophilus influenzae type b (Hib) vaccine.** / Consult your health care provider. ** Family history and personal history of risk and conditions may change your health  care provider's recommendations.   This information is not intended to replace advice given to you by your health care provider. Make sure you discuss any questions you have with your health care provider.   Document Released: 02/28/2001 Document Revised: 01/23/2014 Document Reviewed: 05/30/2010 Elsevier Interactive Patient Education [employer].

## 2014-12-31 NOTE — Progress Notes (Signed)
Pre visit review using our clinic review tool, if applicable. No additional management support is needed unless otherwise documented below in the visit note. 

## 2015-01-03 ENCOUNTER — Encounter: Payer: Self-pay | Admitting: Family Medicine

## 2015-01-03 DIAGNOSIS — J209 Acute bronchitis, unspecified: Secondary | ICD-10-CM

## 2015-01-03 HISTORY — DX: Acute bronchitis, unspecified: J20.9

## 2015-01-03 NOTE — Assessment & Plan Note (Signed)
Good response from switch from Adderall to Vyvanse. Continue the same.given refills

## 2015-01-03 NOTE — Progress Notes (Signed)
Subjective:    Patient ID: Katelyn [name], female    DOB: 04/14/1975, 39 y.o.   MRN: [id number]  Chief Complaint  Patient presents with  . Annual Exam    HPI Patient is in today for annual exam and follow-up on medication changes. She has recently started on Vyvanse and has had a good response. She denies any concerning side effects. No headaches, palpitations, anxiety, insomnia or GI complaints. She reports good response with increased concentration. She is also noting cough and congestion. She's had symptoms for 1-2 weeks. She has had congestion, postnasal drip and cough productive of green phlegm. Malaise and myalgias are mild. No fevers chills. Denies CP/palp/SOB/HA/congestion/fevers/GI or GU c/o. Taking meds as prescribed Past Medical History  Diagnosis Date  . Anemia   . GERD (gastroesophageal reflux disease)   . Chicken pox as a child  . Anemia, iron deficiency 07/14/2013  . Preventative health care 07/20/2013  . Acute bronchitis 01/03/2015    Past Surgical History  Procedure Laterality Date  . Myomectomy  2008  . Axillary cyst      left side  . Wisdom tooth extraction      Family History  Problem Relation Age of Onset  . Thyroid disease Mother   . Diabetes Maternal Grandfather     type 2  . Diabetes Paternal Grandmother     type 2  . Heart attack Paternal Grandfather     Social History   Social History  . Marital Status: Married    Spouse Name: N/A  . Number of Children: N/A  . Years of Education: N/A   Occupational History  . Not on file.   Social History Main Topics  . Smoking status: Never Smoker   . Smokeless tobacco: Never Used  . Alcohol Use: No  . Drug Use: No  . Sexual Activity: Yes    Birth Control/ Protection: None     Comment: lives with husband and daughter, work at [employer]. No dietary restrictions, MD   Other Topics Concern  . Not on file   Social History Narrative    Outpatient Prescriptions Prior to Visit  Medication Sig  Dispense Refill  . MARLISSA 0.15-30 MG-MCG tablet Take 1 tablet by mouth daily.  6  . lisdexamfetamine (VYVANSE) 50 MG capsule Take 1 capsule (50 mg total) by mouth daily. 30 capsule 0   No facility-administered medications prior to visit.    No Known Allergies  Review of Systems  Constitutional: Negative for fever, chills and malaise/fatigue.  HENT: Positive for congestion. Negative for hearing loss.   Eyes: Negative for discharge.  Respiratory: Positive for cough and sputum production. Negative for shortness of breath.   Cardiovascular: Negative for chest pain, palpitations and leg swelling.  Gastrointestinal: Negative for heartburn, nausea, vomiting, abdominal pain, diarrhea, constipation and blood in stool.  Genitourinary: Negative for dysuria, urgency, frequency and hematuria.  Musculoskeletal: Negative for myalgias, back pain and falls.  Skin: Negative for rash.  Neurological: Negative for dizziness, sensory change, loss of consciousness, weakness and headaches.  Endo/Heme/Allergies: Negative for environmental allergies. Does not bruise/bleed easily.  Psychiatric/Behavioral: Negative for depression and suicidal ideas. The patient is not nervous/anxious and does not have insomnia.        Objective:    Physical Exam  Constitutional: She is oriented to person, place, and time. She appears well-developed and well-nourished. No distress.  HENT:  Head: Normocephalic and atraumatic.  Eyes: Conjunctivae are normal.  Neck: Neck supple. No thyromegaly present.  Cardiovascular:  Normal rate, regular rhythm and normal heart sounds.   No murmur heard. Pulmonary/Chest: Effort normal and breath sounds normal. No respiratory distress.  Abdominal: Soft. Bowel sounds are normal. She exhibits no distension and no mass. There is no tenderness.  Musculoskeletal: She exhibits no edema.  Lymphadenopathy:    She has no cervical adenopathy.  Neurological: She is alert and oriented to person,  place, and time.  Skin: Skin is warm and dry.  Psychiatric: She has a normal mood and affect. Her behavior is normal.    BP 118/72 mmHg  Pulse 77  Temp(Src) 97.9 F (36.6 C)  Ht 5\' 3"  (1.6 m)  Wt 146 lb 2 oz (66.282 kg)  BMI 25.89 kg/m2  SpO2 98% Wt Readings from Last 3 Encounters:  12/31/14 146 lb 2 oz (66.282 kg)  09/07/14 141 lb (63.957 kg)  08/03/14 142 lb 3.2 oz (64.501 kg)     Lab Results  Component Value Date   WBC 8.3 12/31/2014   HGB 11.0* 12/31/2014   HCT 35.4* 12/31/2014   PLT 395.0 12/31/2014   GLUCOSE 72 12/31/2014   CHOL 174 12/31/2014   TRIG 127.0 12/31/2014   HDL 69.70 12/31/2014   LDLCALC 78 12/31/2014   ALT 23 12/31/2014   AST 19 12/31/2014   NA 138 12/31/2014   K 3.6 12/31/2014   CL 106 12/31/2014   CREATININE 0.69 12/31/2014   BUN 14 12/31/2014   CO2 26 12/31/2014   TSH 3.14 12/31/2014    Lab Results  Component Value Date   TSH 3.14 12/31/2014   Lab Results  Component Value Date   WBC 8.3 12/31/2014   HGB 11.0* 12/31/2014   HCT 35.4* 12/31/2014   MCV 72.9* 12/31/2014   PLT 395.0 12/31/2014   Lab Results  Component Value Date   NA 138 12/31/2014   K 3.6 12/31/2014   CO2 26 12/31/2014   GLUCOSE 72 12/31/2014   BUN 14 12/31/2014   CREATININE 0.69 12/31/2014   BILITOT 0.2 12/31/2014   ALKPHOS 54 12/31/2014   AST 19 12/31/2014   ALT 23 12/31/2014   PROT 7.4 12/31/2014   ALBUMIN 4.1 12/31/2014   CALCIUM 9.1 12/31/2014   GFR 100.88 12/31/2014   Lab Results  Component Value Date   CHOL 174 12/31/2014   Lab Results  Component Value Date   HDL 69.70 12/31/2014   Lab Results  Component Value Date   LDLCALC 78 12/31/2014   Lab Results  Component Value Date   TRIG 127.0 12/31/2014   Lab Results  Component Value Date   CHOLHDL 2 12/31/2014   No results found for: HGBA1C     Assessment & Plan:   Problem List Items Addressed This Visit    Acute bronchitis    Encouraged increased rest and hydration, add probiotics,  zinc such as Coldeze or Xicam. Treat fevers as needed. Given rx for Zpak and return if no improvement      ADD (attention deficit disorder)    Good response from switch from Adderall to Vyvanse. Continue the same.given refills      Anemia, iron deficiency    Increase leafy greens, consider increased lean red meat and using cast iron cookware. Continue to monitor, report any concerns      Preventative health care - Primary    Patient encouraged to maintain heart healthy diet, regular exercise, adequate sleep. Consider daily probiotics. Take medications as prescribed. Given and reviewed copy of ACP documents from [employer] and encouraged  to complete and return. Follows with GYN. Labs reviewed.      Relevant Orders   TSH (Completed)   CBC (Completed)   Comprehensive metabolic panel (Completed)   Lipid panel (Completed)      I have discontinued Ms. Mccollum's lisdexamfetamine. I have also changed her lisdexamfetamine. Additionally, I am having her start on azithromycin, lisdexamfetamine, and lisdexamfetamine. Lastly, I am having her maintain her MARLISSA.  Meds ordered this encounter  Medications  . DISCONTD: lisdexamfetamine (VYVANSE) 50 MG capsule    Sig: Take 1 capsule (50 mg total) by mouth daily.    Dispense:  30 capsule    Refill:  0    Can fill on or after 11/06/14  . azithromycin (ZITHROMAX) 250 MG tablet    Sig: 2 tabs po once then 1 tab po daily    Dispense:  6 tablet    Refill:  0  . lisdexamfetamine (VYVANSE) 50 MG capsule    Sig: Take 1 capsule (50 mg total) by mouth daily. December 2016    Dispense:  30 capsule    Refill:  0  . lisdexamfetamine (VYVANSE) 50 MG capsule    Sig: Take 1 capsule (50 mg total) by mouth daily. February 2017    Dispense:  30 capsule    Refill:  0  . lisdexamfetamine (VYVANSE) 50 MG capsule    Sig: Take 1 capsule (50 mg total) by mouth daily. January 2017    Dispense:  30 capsule    Refill:  0     Penni Homans, MD

## 2015-01-03 NOTE — Assessment & Plan Note (Signed)
Patient encouraged to maintain heart healthy diet, regular exercise, adequate sleep. Consider daily probiotics. Take medications as prescribed. Given and reviewed copy of ACP documents from [employer] and encouraged to complete and return. Follows with GYN. Labs reviewed.

## 2015-01-03 NOTE — Assessment & Plan Note (Signed)
Encouraged increased rest and hydration, add probiotics, zinc such as Coldeze or Xicam. Treat fevers as needed. Given rx for Zpak and return if no improvement

## 2015-01-03 NOTE — Assessment & Plan Note (Signed)
Increase leafy greens, consider increased lean red meat and using cast iron cookware. Continue to monitor, report any concerns 

## 2015-01-17 NOTE — L&D Delivery Note (Signed)
Pt was admitted with SROM. She progressed along a nl labor curve. She pushed for 45 min and had a SVD of one live viable white female infant over an intact perineum in the ROA position. Nuchal cord x 1. EBL-400cc. Baby to NBN.Placenta-S/I.

## 2015-02-22 LAB — OB RESULTS CONSOLE GC/CHLAMYDIA
Chlamydia: NEGATIVE
Gonorrhea: NEGATIVE

## 2015-02-22 LAB — OB RESULTS CONSOLE HIV ANTIBODY (ROUTINE TESTING): HIV: NONREACTIVE

## 2015-02-22 LAB — OB RESULTS CONSOLE RPR: RPR: NONREACTIVE

## 2015-07-06 ENCOUNTER — Ambulatory Visit: Payer: Managed Care, Other (non HMO) | Admitting: Family Medicine

## 2015-08-10 LAB — OB RESULTS CONSOLE GBS: STREP GROUP B AG: POSITIVE

## 2015-08-16 ENCOUNTER — Inpatient Hospital Stay (HOSPITAL_COMMUNITY)
Admission: AD | Admit: 2015-08-16 | Discharge: 2015-08-16 | Disposition: A | Discharge status: Home / Self Care | Payer: Managed Care, Other (non HMO) | Source: Ambulatory Visit | Attending: Obstetrics | Admitting: Obstetrics

## 2015-08-16 ENCOUNTER — Encounter (HOSPITAL_COMMUNITY): Payer: Self-pay | Admitting: *Deleted

## 2015-08-16 DIAGNOSIS — Z3493 Encounter for supervision of normal pregnancy, unspecified, third trimester: Secondary | ICD-10-CM | POA: Insufficient documentation

## 2015-08-16 MED ORDER — LACTATED RINGERS IV BOLUS (SEPSIS)
1000.0000 mL | Freq: Once | INTRAVENOUS | Status: AC
  Administered 2015-08-16: 1000 mL via INTRAVENOUS

## 2015-08-16 NOTE — MAU Note (Signed)
Notified MD that patient was in MAU. Reported current status.  Dr. Carlis Abbott will be over in 20 minutes to evalute.

## 2015-08-16 NOTE — Discharge Instructions (Signed)
Braxton Hicks Contractions °Contractions of the uterus can occur throughout pregnancy. Contractions are not always a sign that you are in labor.  °WHAT ARE BRAXTON HICKS CONTRACTIONS?  °Contractions that occur before labor are called Braxton Hicks contractions, or false labor. Toward the end of pregnancy (32-34 weeks), these contractions can develop more often and may become more forceful. This is not true labor because these contractions do not result in opening (dilatation) and thinning of the cervix. They are sometimes difficult to tell apart from true labor because these contractions can be forceful and people have different pain tolerances. You should not feel embarrassed if you go to the hospital with false labor. Sometimes, the only way to tell if you are in true labor is for your health care provider to look for changes in the cervix. °If there are no prenatal problems or other health problems associated with the pregnancy, it is completely safe to be sent home with false labor and await the onset of true labor. °HOW CAN YOU TELL THE DIFFERENCE BETWEEN TRUE AND FALSE LABOR? °False Labor °· The contractions of false labor are usually shorter and not as hard as those of true labor.   °· The contractions are usually irregular.   °· The contractions are often felt in the front of the lower abdomen and in the groin.   °· The contractions may go away when you walk around or change positions while lying down.   °· The contractions get weaker and are shorter lasting as time goes on.   °· The contractions do not usually become progressively stronger, regular, and closer together as with true labor.   °True Labor °· Contractions in true labor last 30-70 seconds, become very regular, usually become more intense, and increase in frequency.   °· The contractions do not go away with walking.   °· The discomfort is usually felt in the top of the uterus and spreads to the lower abdomen and low back.   °· True labor can be  determined by your health care provider with an exam. This will show that the cervix is dilating and getting thinner.   °WHAT TO REMEMBER °· Keep up with your usual exercises and follow other instructions given by your health care provider.   °· Take medicines as directed by your health care provider.   °· Keep your regular prenatal appointments.   °· Eat and drink lightly if you think you are going into labor.   °· If Braxton Hicks contractions are making you uncomfortable:   °¨ Change your position from lying down or resting to walking, or from walking to resting.   °¨ Sit and rest in a tub of warm water.   °¨ Drink 2-3 glasses of water. Dehydration may cause these contractions.   °¨ Do slow and deep breathing several times an hour.   °WHEN SHOULD I SEEK IMMEDIATE MEDICAL CARE? °Seek immediate medical care if: °· Your contractions become stronger, more regular, and closer together.   °· You have fluid leaking or gushing from your vagina.   °· You have a fever.   °· You pass blood-tinged mucus.   °· You have vaginal bleeding.   °· You have continuous abdominal pain.   °· You have low back pain that you never had before.   °· You feel your baby's head pushing down and causing pelvic pressure.   °· Your baby is not moving as much as it used to.   °  °This information is not intended to replace advice given to you by your health care provider. Make sure you discuss any questions you have with your health care   provider.   Document Released: 01/02/2005 Document Revised: 01/07/2013 Document Reviewed: 10/14/2012 Elsevier Interactive Patient Education 2016 [employer]. Reschedule office visit for Thursday as discussed.

## 2015-08-18 ENCOUNTER — Encounter (HOSPITAL_COMMUNITY): Payer: Self-pay | Admitting: *Deleted

## 2015-08-18 ENCOUNTER — Inpatient Hospital Stay (HOSPITAL_COMMUNITY): Payer: Managed Care, Other (non HMO) | Admitting: Anesthesiology

## 2015-08-18 ENCOUNTER — Inpatient Hospital Stay (HOSPITAL_COMMUNITY)
Admission: AD | Admit: 2015-08-18 | Discharge: 2015-08-20 | DRG: 775 | Disposition: A | Discharge status: Home / Self Care | Payer: Managed Care, Other (non HMO) | Source: Ambulatory Visit | Attending: Obstetrics and Gynecology | Admitting: Obstetrics and Gynecology

## 2015-08-18 DIAGNOSIS — O872 Hemorrhoids in the puerperium: Secondary | ICD-10-CM | POA: Diagnosis not present

## 2015-08-18 DIAGNOSIS — IMO0001 Reserved for inherently not codable concepts without codable children: Secondary | ICD-10-CM

## 2015-08-18 DIAGNOSIS — Z3A37 37 weeks gestation of pregnancy: Secondary | ICD-10-CM | POA: Diagnosis not present

## 2015-08-18 DIAGNOSIS — Z3490 Encounter for supervision of normal pregnancy, unspecified, unspecified trimester: Secondary | ICD-10-CM

## 2015-08-18 HISTORY — DX: Other specified behavioral and emotional disorders with onset usually occurring in childhood and adolescence: F98.8

## 2015-08-18 HISTORY — DX: Benign neoplasm of connective and other soft tissue, unspecified: D21.9

## 2015-08-18 LAB — CBC
HEMATOCRIT: 33.2 % — AB (ref 36.0–46.0)
Hemoglobin: 10.7 g/dL — ABNORMAL LOW (ref 12.0–15.0)
MCH: 23.7 pg — ABNORMAL LOW (ref 26.0–34.0)
MCHC: 32.2 g/dL (ref 30.0–36.0)
MCV: 73.6 fL — ABNORMAL LOW (ref 78.0–100.0)
Platelets: 276 10*3/uL (ref 150–400)
RBC: 4.51 MIL/uL (ref 3.87–5.11)
RDW: 17 % — AB (ref 11.5–15.5)
WBC: 9.9 10*3/uL (ref 4.0–10.5)

## 2015-08-18 LAB — TYPE AND SCREEN
ABO/RH(D): O POS
ANTIBODY SCREEN: NEGATIVE

## 2015-08-18 MED ORDER — SOD CITRATE-CITRIC ACID 500-334 MG/5ML PO SOLN: 30.0000 mL | ORAL | Status: DC | PRN

## 2015-08-18 MED ORDER — BUTORPHANOL TARTRATE 1 MG/ML IJ SOLN
1.0000 mg | INTRAMUSCULAR | Status: DC | PRN
  Administered 2015-08-18: 1 mg via INTRAVENOUS

## 2015-08-18 MED ORDER — OXYTOCIN BOLUS FROM INFUSION: 500.0000 mL | Freq: Once | INTRAVENOUS | Status: DC

## 2015-08-18 MED ORDER — OXYCODONE-ACETAMINOPHEN 5-325 MG PO TABS: 2.0000 | ORAL_TABLET | ORAL | Status: DC | PRN

## 2015-08-18 MED ORDER — DIPHENHYDRAMINE HCL 50 MG/ML IJ SOLN: 12.5000 mg | INTRAMUSCULAR | Status: DC | PRN

## 2015-08-18 MED ORDER — PHENYLEPHRINE 40 MCG/ML (10ML) SYRINGE FOR IV PUSH (FOR BLOOD PRESSURE SUPPORT)
80.0000 ug | PREFILLED_SYRINGE | INTRAVENOUS | Status: DC | PRN
  Filled 2015-08-18 (×2): qty 10
  Filled 2015-08-18: qty 5
  Filled 2015-08-18: qty 10

## 2015-08-18 MED ORDER — FENTANYL 2.5 MCG/ML BUPIVACAINE 1/10 % EPIDURAL INFUSION (WH - ANES)
14.0000 mL/h | INTRAMUSCULAR | Status: DC | PRN
  Administered 2015-08-18 (×2): 14 mL/h via EPIDURAL
  Filled 2015-08-18: qty 125

## 2015-08-18 MED ORDER — LIDOCAINE HCL (PF) 1 % IJ SOLN
INTRAMUSCULAR | Status: DC | PRN
  Administered 2015-08-18: 8 mL via EPIDURAL
  Administered 2015-08-18: 7 mL via EPIDURAL

## 2015-08-18 MED ORDER — LACTATED RINGERS IV SOLN: 500.0000 mL | Freq: Once | INTRAVENOUS | Status: DC

## 2015-08-18 MED ORDER — SODIUM CHLORIDE 0.9 % IV SOLN
2.0000 g | Freq: Once | INTRAVENOUS | Status: AC
  Administered 2015-08-18: 2 g via INTRAVENOUS
  Filled 2015-08-18: qty 2000

## 2015-08-18 MED ORDER — EPHEDRINE 5 MG/ML INJ
10.0000 mg | INTRAVENOUS | Status: DC | PRN
  Filled 2015-08-18: qty 4

## 2015-08-18 MED ORDER — PHENYLEPHRINE 40 MCG/ML (10ML) SYRINGE FOR IV PUSH (FOR BLOOD PRESSURE SUPPORT)
80.0000 ug | PREFILLED_SYRINGE | INTRAVENOUS | Status: DC | PRN
  Administered 2015-08-18 (×2): 80 ug via INTRAVENOUS
  Filled 2015-08-18: qty 10
  Filled 2015-08-18: qty 5

## 2015-08-18 MED ORDER — ACETAMINOPHEN 325 MG PO TABS
650.0000 mg | ORAL_TABLET | ORAL | Status: DC | PRN
  Filled 2015-08-18: qty 2

## 2015-08-18 MED ORDER — BUTORPHANOL TARTRATE 1 MG/ML IJ SOLN
INTRAMUSCULAR | Status: AC
  Filled 2015-08-18: qty 1

## 2015-08-18 MED ORDER — LACTATED RINGERS IV SOLN: 500.0000 mL | INTRAVENOUS | Status: DC | PRN

## 2015-08-18 MED ORDER — LACTATED RINGERS IV SOLN
INTRAVENOUS | Status: DC
  Administered 2015-08-18: 15:00:00 via INTRAVENOUS

## 2015-08-18 MED ORDER — OXYTOCIN 40 UNITS IN LACTATED RINGERS INFUSION - SIMPLE MED
2.5000 [IU]/h | INTRAVENOUS | Status: DC
  Filled 2015-08-18: qty 1000

## 2015-08-18 MED ORDER — OXYCODONE-ACETAMINOPHEN 5-325 MG PO TABS: 1.0000 | ORAL_TABLET | ORAL | Status: DC | PRN

## 2015-08-18 MED ORDER — LIDOCAINE HCL (PF) 1 % IJ SOLN
30.0000 mL | INTRAMUSCULAR | Status: DC | PRN
  Filled 2015-08-18: qty 30

## 2015-08-18 MED ORDER — ONDANSETRON HCL 4 MG/2ML IJ SOLN: 4.0000 mg | Freq: Four times a day (QID) | INTRAMUSCULAR | Status: DC | PRN

## 2015-08-18 NOTE — Anesthesia Procedure Notes (Signed)
Epidural Patient location during procedure: OB Start time: 08/18/2015 4:12 PM End time: 08/18/2015 4:16 PM  Staffing Anesthesiologist: Lyn Hollingshead Performed: anesthesiologist   Preanesthetic Checklist Completed: patient identified, surgical consent, pre-op evaluation, timeout performed, IV checked, risks and benefits discussed and monitors and equipment checked  Epidural Patient position: sitting Prep: site prepped and draped and DuraPrep Patient monitoring: continuous pulse ox and blood pressure Approach: midline Location: L3-L4 Injection technique: LOR air  Needle:  Needle type: Tuohy  Needle gauge: 17 G Needle length: 9 cm and 9 Needle insertion depth: 6 cm Catheter type: closed end flexible Catheter size: 19 Gauge Catheter at skin depth: 11 cm Test dose: negative and Other  Assessment Sensory level: T9 Events: blood not aspirated, injection not painful, no injection resistance, negative IV test and no paresthesia  Additional Notes Reason for block:procedure for pain

## 2015-08-18 NOTE — H&P (Signed)
Pt is a 40 y/o white female, G3P1011 at 37 weeks who presents to the ER c/o SROM. In the ER she had +fern/+Pool. PNC was complicated by AMA. NIPT-46XY. She also has a h.o. A myomectomy. Cavity was not entered. She has had a vaginal delivery since then. She was 3 cm on admission.  PMHX: see hollister PE: VSSAF        Abd-gravid, palp contractions        FHTs-reactive. IMP/ IUP at term in labor         AMA         H.o. Myomectomy Plan/Admit

## 2015-08-18 NOTE — Anesthesia Preprocedure Evaluation (Signed)
Anesthesia Evaluation  Patient identified by MRN, date of birth, ID band Patient awake    Reviewed: Allergy & Precautions, H&P , NPO status , Patient's Chart, lab work & pertinent test results  Airway Mallampati: I  TM Distance: >3 FB Neck ROM: full    Dental no notable dental hx.    Pulmonary neg pulmonary ROS,    Pulmonary exam normal breath sounds clear to auscultation       Cardiovascular negative cardio ROS Normal cardiovascular exam     Neuro/Psych negative neurological ROS     GI/Hepatic Neg liver ROS,   Endo/Other  negative endocrine ROS  Renal/GU negative Renal ROS     Musculoskeletal   Abdominal   Peds  Hematology   Anesthesia Other Findings   Reproductive/Obstetrics (+) Pregnancy                             Anesthesia Physical Anesthesia Plan  ASA: II  Anesthesia Plan: Epidural   Post-op Pain Management:    Induction:   Airway Management Planned:   Additional Equipment:   Intra-op Plan:   Post-operative Plan:   Informed Consent: I have reviewed the patients History and Physical, chart, labs and discussed the procedure including the risks, benefits and alternatives for the proposed anesthesia with the patient or authorized representative who has indicated his/her understanding and acceptance.     Plan Discussed with:   Anesthesia Plan Comments:         Anesthesia Quick Evaluation

## 2015-08-18 NOTE — Anesthesia Pain Management Evaluation Note (Signed)
  [facility]NA Pain Management Visit Note  Patient: Katelyn [name], 40 y.o., female  "Hello I am a member of the anesthesia team at [facility]. We have an anesthesia team available at all times to provide care throughout the hospital, including epidural management and anesthesia for C-section. I don't know your plan for the delivery whether it a natural birth, water birth, IV sedation, nitrous supplementation, doula or epidural, but we want to meet your pain goals."   1.Was your pain managed to your expectations on prior hospitalizations?   Yes   2.What is your expectation for pain management during this hospitalization?     Epidural  3.How can we help you reach that goal? awaiting epidural  Record the patient's initial score and the patient's pain goal.   Pain: 10  Pain Goal: 4 [facility] wants you to be able to say your pain was always managed very well.  Casimer Lanius 08/18/2015

## 2015-08-19 DIAGNOSIS — Z3490 Encounter for supervision of normal pregnancy, unspecified, unspecified trimester: Secondary | ICD-10-CM

## 2015-08-19 LAB — CBC
HEMATOCRIT: 28 % — AB (ref 36.0–46.0)
HEMOGLOBIN: 9 g/dL — AB (ref 12.0–15.0)
MCH: 23.9 pg — ABNORMAL LOW (ref 26.0–34.0)
MCHC: 32.1 g/dL (ref 30.0–36.0)
MCV: 74.3 fL — AB (ref 78.0–100.0)
Platelets: 215 10*3/uL (ref 150–400)
RBC: 3.77 MIL/uL — AB (ref 3.87–5.11)
RDW: 17 % — AB (ref 11.5–15.5)
WBC: 13.1 10*3/uL — AB (ref 4.0–10.5)

## 2015-08-19 LAB — RPR: RPR Ser Ql: NONREACTIVE

## 2015-08-19 MED ORDER — ONDANSETRON HCL 4 MG PO TABS: 4.0000 mg | ORAL_TABLET | ORAL | Status: DC | PRN

## 2015-08-19 MED ORDER — SENNOSIDES-DOCUSATE SODIUM 8.6-50 MG PO TABS
2.0000 | ORAL_TABLET | ORAL | Status: DC
  Administered 2015-08-19: 2 via ORAL
  Filled 2015-08-19: qty 2

## 2015-08-19 MED ORDER — COCONUT OIL OIL
1.0000 "application " | TOPICAL_OIL | Status: DC | PRN
  Administered 2015-08-20: 1 via TOPICAL
  Filled 2015-08-19: qty 120

## 2015-08-19 MED ORDER — MEASLES, MUMPS & RUBELLA VAC ~~LOC~~ INJ
0.5000 mL | INJECTION | Freq: Once | SUBCUTANEOUS | Status: DC
  Filled 2015-08-19: qty 0.5

## 2015-08-19 MED ORDER — ONDANSETRON HCL 4 MG/2ML IJ SOLN
4.0000 mg | INTRAMUSCULAR | Status: DC | PRN
  Administered 2015-08-19: 4 mg via INTRAVENOUS
  Filled 2015-08-19: qty 2

## 2015-08-19 MED ORDER — SIMETHICONE 80 MG PO CHEW
80.0000 mg | CHEWABLE_TABLET | ORAL | Status: DC | PRN
  Administered 2015-08-19: 80 mg via ORAL
  Filled 2015-08-19: qty 1

## 2015-08-19 MED ORDER — TETANUS-DIPHTH-ACELL PERTUSSIS 5-2.5-18.5 LF-MCG/0.5 IM SUSP: 0.5000 mL | Freq: Once | INTRAMUSCULAR | Status: DC

## 2015-08-19 MED ORDER — IBUPROFEN 600 MG PO TABS
600.0000 mg | ORAL_TABLET | Freq: Four times a day (QID) | ORAL | Status: DC
  Administered 2015-08-19 – 2015-08-20 (×6): 600 mg via ORAL
  Filled 2015-08-19 (×6): qty 1

## 2015-08-19 MED ORDER — DIBUCAINE 1 % RE OINT
1.0000 "application " | TOPICAL_OINTMENT | RECTAL | Status: DC | PRN
  Administered 2015-08-19: 1 via RECTAL
  Filled 2015-08-19: qty 28

## 2015-08-19 MED ORDER — ZOLPIDEM TARTRATE 5 MG PO TABS: 5.0000 mg | ORAL_TABLET | Freq: Every evening | ORAL | Status: DC | PRN

## 2015-08-19 MED ORDER — OXYCODONE-ACETAMINOPHEN 5-325 MG PO TABS
1.0000 | ORAL_TABLET | ORAL | Status: DC | PRN
  Administered 2015-08-19 – 2015-08-20 (×4): 1 via ORAL
  Filled 2015-08-19 (×4): qty 1

## 2015-08-19 MED ORDER — WITCH HAZEL-GLYCERIN EX PADS
1.0000 "application " | MEDICATED_PAD | CUTANEOUS | Status: DC | PRN
  Administered 2015-08-19: 1 via TOPICAL

## 2015-08-19 MED ORDER — BENZOCAINE-MENTHOL 20-0.5 % EX AERO
1.0000 "application " | INHALATION_SPRAY | CUTANEOUS | Status: DC | PRN
  Administered 2015-08-19: 1 via TOPICAL
  Filled 2015-08-19: qty 56

## 2015-08-19 MED ORDER — ACETAMINOPHEN 325 MG PO TABS
650.0000 mg | ORAL_TABLET | ORAL | Status: DC | PRN
  Administered 2015-08-19 (×2): 650 mg via ORAL
  Filled 2015-08-19 (×3): qty 2

## 2015-08-19 NOTE — Anesthesia Postprocedure Evaluation (Signed)
Anesthesia Post Note  Patient: Katelyn [name]  Procedure(s) Performed: * No procedures listed *  Patient location during evaluation: Mother Baby Anesthesia Type: Epidural Level of consciousness: awake Pain management: satisfactory to patient Vital Signs Assessment: post-procedure vital signs reviewed and stable Respiratory status: spontaneous breathing Cardiovascular status: stable Anesthetic complications: no     Last Vitals:  Vitals:   08/19/15 0140 08/19/15 0540  BP: 127/72 136/66  Pulse: 69 (!) 53  Resp: 16 16  Temp: 36.9 C 36.7 C    Last Pain:  Vitals:   08/19/15 0540  TempSrc: Oral  PainSc: 2    Pain Goal: Patients Stated Pain Goal: 0 (08/18/15 1438)               Casimer Lanius

## 2015-08-19 NOTE — Addendum Note (Signed)
Addendum  created 08/19/15 0813 by Asher Muir, CRNA   Charge Capture section accepted, Sign clinical note

## 2015-08-19 NOTE — Lactation Note (Signed)
This note was copied from a baby's chart. Lactation Consultation Note  Patient Name: Katelyn [name] [id number] Date: 08/19/2015 Reason for consult: Follow-up assessment;Late preterm infant  Mom requests feeding assessment.  She states the last feeding went well.  Mom also post pumping and giving small amounts of colostrum to baby by syringe.  Mom states she prefers side lying position.  Assisted with positioning and mom easily hand expressed a few drops of colostrum.  Baby opened wide and latched easily and well to breast.  Observed baby nurse for 20 minutes and he was actively nursing when I left.  Mom reports strong uterine cramping during feeding.  Encouraged to call out for assist/concerns prn.   Maternal Data    Feeding Feeding Type: Breast Fed Length of feed: 30 min  LATCH Score/Interventions Latch: Grasps breast easily, tongue down, lips flanged, rhythmical sucking. Intervention(s): Adjust position;Assist with latch;Breast massage;Breast compression  Audible Swallowing: A few with stimulation Intervention(s): Hand expression Intervention(s): Alternate breast massage  Type of Nipple: Everted at rest and after stimulation  Comfort (Breast/Nipple): Soft / non-tender     Hold (Positioning): Assistance needed to correctly position infant at breast and maintain latch. Intervention(s): Breastfeeding basics reviewed;Support Pillows;Position options  LATCH Score: 8  Lactation Tools Discussed/Used     Consult Status Consult Status: Follow-up Date: 08/20/15 Follow-up type: In-patient    Ave Filter 08/19/2015, 4:58 PM

## 2015-08-19 NOTE — Progress Notes (Signed)
Post Partum Day 1 Subjective: Perineum is swollen and sore, up ad lib, voiding, tolerating PO and pumping and breast feeding  Objective: Blood pressure 136/66, pulse (!) 53, temperature 98.1 F (36.7 C), temperature source Oral, resp. rate 16, height 5\' 3"  (1.6 m), weight 75.8 kg (167 lb), last menstrual period 09/05/2014.  Physical Exam:  General: alert, cooperative and no distress Lochia: appropriate Uterine Fundus: firm perineum: healing well, no significant drainage, large swollen hemorrhoid DVT Evaluation: No evidence of DVT seen on physical exam. Negative Homan's sign. No cords or calf tenderness.   Recent Labs  08/18/15 1455 08/19/15 0402  HGB 10.7* 9.0*  HCT 33.2* 28.0*    Assessment/Plan: Plan for discharge tomorrow, Breastfeeding and Circumcision deferred today and baby was choking, and had not Voided. Will do circ prior to discharge   LOS: 1 day   [place], [place] 08/19/2015, 9:30 AM

## 2015-08-19 NOTE — Lactation Note (Addendum)
This note was copied from a baby's chart. Lactation Consultation Note Experienced BF mom had supply issues. Mom BF and pumped for 9 months, mainly BF for comfort, pumped and supplemented w/formula d/t low supply. Mom has DEBP set up by RN, pumped 66ml colostrum, gave to baby. Encouraged to pump q 3 hrs.  Mom has pendulum breast w/nipple at the end of breast. Suggested to elevate breast w/cloth when BF for support. Mom wants to be able to BF only this time. Excited that she pumped 22ml. Mom encouraged to feed baby 8-12 times/24 hours and with feeding cues. Referred to Baby and Me Book in Breastfeeding section Pg. 22-23 for position options and Proper latch demonstration. Mom encouraged to do skin-to-skin. LPI information sheet given and reviewed. Katelyn [name] given w/resources, support groups and [place] services.  Patient Name: Katelyn [name] [id number] Date: 08/19/2015 Reason for consult: Initial assessment   Maternal Data Has patient been taught Hand Expression?: Yes Does the patient have breastfeeding experience prior to this delivery?: Yes  Feeding Feeding Type: Breast Milk  LATCH Score/Interventions Latch: Repeated attempts needed to sustain latch, nipple held in mouth throughout feeding, stimulation needed to elicit sucking reflex. Intervention(s): Adjust position;Assist with latch  Audible Swallowing: None Intervention(s): Skin to skin;Hand expression  Type of Nipple: Everted at rest and after stimulation  Comfort (Breast/Nipple): Soft / non-tender     Hold (Positioning): Assistance needed to correctly position infant at breast and maintain latch. Intervention(s): Breastfeeding basics reviewed;Support Pillows;Position options;Skin to skin  LATCH Score: 6  Lactation Tools Discussed/Used Tools: Pump Breast pump type: Double-Electric Breast Pump WIC Program: No Pump Review: Setup, frequency, and cleaning Initiated by:: mary fitch,rn Date initiated:: 08/19/15   Consult  Status Consult Status: Follow-up Date: 08/19/15 Follow-up type: In-patient    Ryan Palermo, Elta [place] 08/19/2015, 3:59 AM

## 2015-08-19 NOTE — Anesthesia Postprocedure Evaluation (Signed)
Anesthesia Post Note  Patient: Katelyn [name]  Procedure(s) Performed: * No procedures listed *  Patient location during evaluation: Mother Baby Anesthesia Type: Epidural Level of consciousness: awake and alert Pain management: pain level controlled Vital Signs Assessment: post-procedure vital signs reviewed and stable Respiratory status: spontaneous breathing, nonlabored ventilation and respiratory function stable Cardiovascular status: stable Postop Assessment: no headache, no backache and epidural receding Anesthetic complications: no     Last Vitals:  Vitals:   08/18/15 2246 08/18/15 2303  BP: 136/86 (!) 147/81  Pulse: 85 69  Resp:    Temp:      Last Pain:  Vitals:   08/18/15 2316  TempSrc:   PainSc: 0-No pain   Pain Goal: Patients Stated Pain Goal: 0 (08/18/15 1438)               [name] A

## 2015-08-20 ENCOUNTER — Encounter (HOSPITAL_COMMUNITY): Payer: Self-pay | Admitting: *Deleted

## 2015-08-20 MED ORDER — IBUPROFEN 600 MG PO TABS: 600.0000 mg | ORAL_TABLET | Freq: Four times a day (QID) | ORAL | 0 refills | Status: DC

## 2015-08-20 MED ORDER — OXYCODONE HCL 5 MG PO TABS
5.0000 mg | ORAL_TABLET | ORAL | 0 refills | Status: DC | PRN
Start: 2015-08-20 — End: 2016-05-02

## 2015-08-20 NOTE — Discharge Summary (Signed)
Obstetric Discharge Summary Reason for Admission: rupture of membranes Prenatal Procedures: none Intrapartum Procedures: spontaneous vaginal delivery Postpartum Procedures: none Complications-Operative and Postpartum: none Hemoglobin  Date Value Ref Range Status  08/19/2015 9.0 (L) 12.0 - 15.0 g/dL Final   HCT  Date Value Ref Range Status  08/19/2015 28.0 (L) 36.0 - 46.0 % Final    Physical Exam:  General: alert, cooperative and appears stated age 40: appropriate Uterine Fundus: firm DVT Evaluation: No evidence of DVT seen on physical exam.  Discharge Diagnoses: Term Pregnancy-delivered  Discharge Information: Date: 08/20/2015 Activity: pelvic rest Diet: routine Medications: PNV, Ibuprofen, Iron and oxycodone Condition: stable Instructions: refer to practice specific booklet Discharge to: home Follow-up Information    Olga Millers, MD Follow up in 4 week(s).   Specialty:  Obstetrics and Gynecology Contact information: [place] [address] [phone]           Newborn Data: Live born female  Birth Weight: 6 lb 7.2 oz (2926 g) APGAR: 8, 9  Home with mother.  [place] 08/20/2015, 8:30 AM

## 2015-08-20 NOTE — Lactation Note (Signed)
This note was copied from a baby's chart. Lactation Consultation Note  Baby is staying another night related to hyperbilirubinemia and late preterm status.  Mom needs reassurance. Reviewed hand expression with her and supplementation needs and volume guidelines.  Attempted to BF baby after circumcision but he was too sleepy. Explained to parents that if baby was not active at the breast to end the feeding and give the supplement. They will used curved tip syringe for now. Parents are satisfied with plan. Mother encouraged to call for assistance as needed.   Patient Name: Katelyn [name] [id number] Date: 08/20/2015 Reason for consult: Follow-up assessment   Maternal Data Has patient been taught Hand Expression?: Yes  Feeding Feeding Type: Breast Fed Length of feed: 2 min  LATCH Score/Interventions Latch: Repeated attempts needed to sustain latch, nipple held in mouth throughout feeding, stimulation needed to elicit sucking reflex. Intervention(s): Adjust position;Assist with latch;Breast compression  Audible Swallowing: None Intervention(s): Skin to skin;Hand expression Intervention(s): Skin to skin;Hand expression  Type of Nipple: Everted at rest and after stimulation  Comfort (Breast/Nipple): Soft / non-tender     Hold (Positioning): Assistance needed to correctly position infant at breast and maintain latch. Intervention(s): Breastfeeding basics reviewed;Support Pillows;Position options;Skin to skin  LATCH Score: 6  Lactation Tools Discussed/Used     Consult Status Consult Status: Follow-up Date: 08/21/15 Follow-up type: In-patient    Van Clines 08/20/2015, 10:27 AM

## 2015-08-20 NOTE — Discharge Instructions (Signed)
Pelvic rest x 6 weeks (no intercourse or tampons).   Do not drive until you are not taking narcotic pain medication.   Call your doctor if you have heavy vaginal bleeding (soaking through a pad an hour or more for >2 hours in a row), temperature >101F, severe nausea, vomiting, severe or worsening abdominal pain, dizziness, shortness of breath, chest pain or any other concerns.  Please take motrin every 6 hours.  Add oxycodone if your pain is not controlled by motrin alone.   You have a mild anemia.  Please add one tablet of iron daily.

## 2015-08-20 NOTE — Progress Notes (Signed)
Patient is doing well.  She is ambulating, voiding, tolerating PO.  Pain control is good.  Lochia is appropriate  Vitals:   08/19/15 1130 08/19/15 1205 08/19/15 1810 08/19/15 1921  BP: (!) 148/89 138/86 (!) 137/95 133/80  Pulse: 70 71 75 77  Resp: 16  18 18   Temp: 97.6 F (36.4 C)  97.8 F (36.6 C)   TempSrc: Axillary  Oral   SpO2: 98%     Weight:      Height:        NAD Fundus firm Ext: trace edema b/l  Lab Results  Component Value Date   WBC 13.1 (H) 08/19/2015   HGB 9.0 (L) 08/19/2015   HCT 28.0 (L) 08/19/2015   MCV 74.3 (L) 08/19/2015   PLT 215 08/19/2015    --/--/O POS (08/02 1455)/RImmune  A/P 40 y.o. G3P1011 PPD#2 s/p TSVD. Routine care.  BPs have been mild range, asymptomatic.  Will monitor BP at home daily Meeting all goals, d/c today.  Desires circumcision. Discussed r/b/a of the procedure. Reviewed that circumcision is an elective surgical procedure and not considered medically necessary. Reviewed the risks of the procedure including the risk of infection, bleeding, damage to surrounding structures, including scrotum, shaft, urethra and head of penis, and an undesired cosmetic effect requiring additional procedures for revision. Consent signed.      [place]

## 2015-08-20 NOTE — Plan of Care (Signed)
Problem: Health Behavior/Discharge Planning: Goal: Ability to manage health-related needs will improve Outcome: Completed/Met Date Met: 08/20/15 Pt given discharge instructions and paperwork.  Pt educated on when to contact the MD and when to seek emergency health.  Pt educated on postpartum depression and breastfeeding resources.

## 2015-08-21 ENCOUNTER — Ambulatory Visit: Payer: Self-pay

## 2015-08-21 NOTE — Lactation Note (Signed)
This note was copied from a baby's chart. Lactation Consultation Note Follow up consult with this mom and LPI, now 37 1/7 weeks CGA, and 80 hours old. Parents have been syringe feeding the baby formula supplement, and baby has been spitty. I reweighed him, and his weight is stable.  I advised parents to feed him supplement with a bottle - he will get less air, and ho[pefully be less spitty. I also advised them to burp him after bottle feeding, and hold him upright for 30 minutes.  I assisted mom with latching the baby in football hold. He latched deeply and suckled well for 12 minutes, lots of visible swallows, and lots of breast movement. Mom had been making a breathing space for the baby, and she was experiencing a very painful latch. I explained that she was pulling her nipple between his gums by doing this. This latch is now very comfortable, with his nose close to mom's breast I then had mom pump in maintenance setting for 15 minutes, followed by hand expression. Mom expressed 14 ml's of transitional milk, which will be used at next feeding. Mom advised to pump at least every 3, and always follow with hand expression. The baby was content. I advised parents to eat and then nap, and feed baby with cues. Mom knows to call for questions/conerns.   Patient Name: Katelyn [name] [id number] Date: 08/21/2015 Reason for consult: Follow-up assessment   Maternal Data    Feeding Feeding Type: Breast Fed  LATCH Score/Interventions Latch: Grasps breast easily, tongue down, lips flanged, rhythmical sucking. Intervention(s): Adjust position;Assist with latch  Audible Swallowing: Spontaneous and intermittent Intervention(s): Skin to skin;Hand expression  Type of Nipple: Everted at rest and after stimulation Intervention(s): Double electric pump  Comfort (Breast/Nipple): Filling, red/small blisters or bruises, mild/mod discomfort  Problem noted: Filling (very comfortabel latach ) Interventions  (Mild/moderate discomfort): Hand expression  Hold (Positioning): Assistance needed to correctly position infant at breast and maintain latch.  LATCH Score: 8  Lactation Tools Discussed/Used Breast pump type: Double-Electric Breast Pump Pump Review: Setup, frequency, and cleaning;Milk Storage;Other (comment) (hand expression reviewed)   Consult Status Consult Status: Follow-up Date: 08/22/15 Follow-up type: In-patient    [name] 08/21/2015, 2:48 PM

## 2015-08-22 ENCOUNTER — Ambulatory Visit: Payer: Self-pay

## 2015-08-22 NOTE — Lactation Note (Signed)
This note was copied from a baby's chart. Lactation Consultation Note With this now 21 2/[redacted] week gestation baby, 47 hours old. And going home if his 1400 bili is trending down. Mom is breast feeding every 3 hours, followed with supplement up to 40 ml's with EBM. Mom has an o/p appointme for Friday, 8/11 at 1030. Mom knows to call for questions/concerns.  Patient Name: Katelyn [name] [id number] Date: 08/22/2015 Reason for consult: Follow-up assessment   Maternal Data    Feeding Feeding Type: Breast Fed Length of feed: 15 min  LATCH Score/Interventions Latch: Grasps breast easily, tongue down, lips flanged, rhythmical sucking.  Audible Swallowing: Spontaneous and intermittent (visual) Intervention(s): Skin to skin;Hand expression  Type of Nipple: Everted at rest and after stimulation Intervention(s): Double electric pump  Comfort (Breast/Nipple): Filling, red/small blisters or bruises, mild/mod discomfort  Problem noted: Filling  Hold (Positioning): Assistance needed to correctly position infant at breast and maintain latch.  LATCH Score: 8  Lactation Tools Discussed/Used     Consult Status Consult Status: Follow-up Date: 08/27/15 Follow-up type: Out-patient    [name] 08/22/2015, 2:14 PM

## 2015-08-27 ENCOUNTER — Ambulatory Visit (HOSPITAL_COMMUNITY): Admission: RE | Admit: 2015-08-27 | Payer: Managed Care, Other (non HMO) | Source: Ambulatory Visit

## 2016-01-04 ENCOUNTER — Encounter: Payer: Managed Care, Other (non HMO) | Admitting: Family Medicine

## 2016-02-28 ENCOUNTER — Telehealth: Payer: Self-pay

## 2016-02-28 MED ORDER — OSELTAMIVIR PHOSPHATE 75 MG PO CAPS: 75.0000 mg | ORAL_CAPSULE | Freq: Two times a day (BID) | ORAL | 0 refills | Status: AC

## 2016-02-28 NOTE — Telephone Encounter (Signed)
Per Dr. Charlett Blake ok to send Tamiflu 75 mg to pharmacy. Patient to take BID x 5  days. Patient notified.

## 2016-04-04 ENCOUNTER — Telehealth: Payer: Self-pay | Admitting: Family Medicine

## 2016-04-04 NOTE — Telephone Encounter (Signed)
Katelyn [name] Self [phone]  Pt called back re: rescheduling cpe from 04/07/16 as provider will be out of office. Pt states that she timed appt appropriately so that she would be able to discuss going back on vyvanse. Pt was pregnant then breast feeding. Pt has stopped breast feeding now and is wanting to re-start med. Please advise if pt appt can be moved up (needs late day appts) or if meds can be filled until her rescheduled cpe 08/07/16.

## 2016-04-05 NOTE — Telephone Encounter (Signed)
Can restart the Vyvanvse 40 mg tabs, 1 tab po qd disp #30 with contract then give her an appt in about a month to check vitals and do UDS and then she can have 3 more months to cover her til July appt.

## 2016-04-06 ENCOUNTER — Telehealth: Payer: Self-pay | Admitting: Family Medicine

## 2016-04-06 ENCOUNTER — Encounter: Payer: Self-pay | Admitting: Family Medicine

## 2016-04-06 MED ORDER — LISDEXAMFETAMINE DIMESYLATE 40 MG PO CAPS: 40.0000 mg | ORAL_CAPSULE | Freq: Every day | ORAL | 0 refills | Status: DC

## 2016-04-06 NOTE — Telephone Encounter (Signed)
Pt called in to follow up on message. Informed pt of response from PCP. Pt would like a call when ready for pick up.    Katelyn [name] - pt says that she will be sleeping so if we could call her spouse for pick up.

## 2016-04-06 NOTE — Telephone Encounter (Signed)
Husband informed of information. Hardcopy/contract at the front with note attached of all PCP instructions/also informed the husband we well.

## 2016-04-06 NOTE — Telephone Encounter (Signed)
Called left message to call back. (see other telephone note)

## 2016-04-06 NOTE — Telephone Encounter (Signed)
Printed prescription Called the patient left message at [phone] left message to call bavck.

## 2016-04-07 ENCOUNTER — Encounter: Payer: Managed Care, Other (non HMO) | Admitting: Family Medicine

## 2016-04-12 ENCOUNTER — Telehealth: Payer: Self-pay | Admitting: Family Medicine

## 2016-04-12 NOTE — Telephone Encounter (Signed)
Caller name: Relationship to patient: Self Can be reached: [phone]  Pharmacy:  Reason for call: Patient thinks her dosage of Vyvanse is incorrect. Thinks she should be taking 50 mg

## 2016-04-13 NOTE — Telephone Encounter (Signed)
Because she has been off awhile she needs to start back at 40 and let us check her BP then we can go back up to 50

## 2016-04-13 NOTE — Telephone Encounter (Signed)
She has a BP check already and will be in and can discuss going up.

## 2016-05-02 ENCOUNTER — Ambulatory Visit (INDEPENDENT_AMBULATORY_CARE_PROVIDER_SITE_OTHER): Payer: Managed Care, Other (non HMO) | Admitting: Family Medicine

## 2016-05-02 ENCOUNTER — Other Ambulatory Visit: Payer: Self-pay

## 2016-05-02 VITALS — BP 122/88 | HR 82

## 2016-05-02 DIAGNOSIS — F908 Attention-deficit hyperactivity disorder, other type: Secondary | ICD-10-CM

## 2016-05-02 MED ORDER — LISDEXAMFETAMINE DIMESYLATE 50 MG PO CAPS: 50.0000 mg | ORAL_CAPSULE | Freq: Every day | ORAL | 0 refills | Status: DC

## 2016-05-02 NOTE — Progress Notes (Signed)
Pre visit review using our clinic tool,if applicable. No additional management support is needed unless otherwise documented below in the visit note.   Patient in for BP check per order from S. Charlett Blake, M.D. Dated 04/12/16 due to use of Vyvanse.  BP=122/88 P =82  Per Dr. Charlett Blake ok to increase Vyvanse to 50 mg as long as patient is seen every 6 month by provider. Will need to be seen in 2 months while taking medication.  Patient agreed states she has appointment scheduled at this time. New Rx printed and given to patient.  Nurse blood pressure and medication check note reviewed. Agree with documention and plan.  Penni Homans, MD

## 2016-05-02 NOTE — Progress Notes (Signed)
Nurseblood pressure check note reviewed. Agree with documention and plan. 

## 2016-05-15 ENCOUNTER — Ambulatory Visit: Payer: Managed Care, Other (non HMO) | Admitting: Family Medicine

## 2016-05-26 ENCOUNTER — Telehealth: Payer: Self-pay | Admitting: Family Medicine

## 2016-05-26 NOTE — Telephone Encounter (Signed)
I just reread my message and she is leaving on vacation the morning of 05/30/16 not 05/16/16

## 2016-05-26 NOTE — Telephone Encounter (Signed)
Self.  Refill request for lisdexamfetamine. Pt says that she just went back to work from maturity leave and would like to know if she could have a Rx until appt. so that she can avoid having to miss additional days?    Please advise.     CB: [phone]

## 2016-05-26 NOTE — Telephone Encounter (Signed)
SHE IS GOING ON VACATION Tuesday AM 05/16/16 AND RETURNS ON Friday. SHE IS AWARE REQUEST IS NOT DUE UNTIL THE 17TH ,BUT WILL BE GONE THEN ON VACATION. CALL WHEN READY.   Requesting:Vyvanse Contract   04/06/16 UDS  NONE Last OV   05/02/16 Last Refill   #30 on 05/02/16  Please Advise

## 2016-05-28 NOTE — Telephone Encounter (Signed)
OK to refill on 5/14

## 2016-05-29 MED ORDER — LISDEXAMFETAMINE DIMESYLATE 50 MG PO CAPS: 50.0000 mg | ORAL_CAPSULE | Freq: Every day | ORAL | 0 refills | Status: DC

## 2016-05-29 NOTE — Addendum Note (Signed)
Addended by: Sharon Seller B on: 05/29/2016 07:10 AM   Modules accepted: Orders

## 2016-05-29 NOTE — Telephone Encounter (Signed)
Printed/PCP signed and called the patient informed to pickup hardcopy at the front desk.

## 2016-07-04 ENCOUNTER — Telehealth: Payer: Self-pay | Admitting: Family Medicine

## 2016-07-04 NOTE — Telephone Encounter (Signed)
Requesting:  vyvanse Contract    04/06/16 UDS   None Last OV   05/02/16---next appt is on 08/07/2016 Last Refill  05/19/2016  Please Advise

## 2016-07-04 NOTE — Telephone Encounter (Signed)
Patient called wanting to get refill for lisdexamfetamine (VYVANSE) 50 MG capsule call back [phone]

## 2016-07-05 NOTE — Telephone Encounter (Signed)
She can have refillr

## 2016-07-06 MED ORDER — LISDEXAMFETAMINE DIMESYLATE 50 MG PO CAPS: 50.0000 mg | ORAL_CAPSULE | Freq: Every day | ORAL | 0 refills | Status: DC

## 2016-07-06 NOTE — Telephone Encounter (Signed)
Printed, patient notified , PCP signed and will put  At the front desk for pickup

## 2016-07-10 DIAGNOSIS — Z8719 Personal history of other diseases of the digestive system: Secondary | ICD-10-CM

## 2016-07-10 HISTORY — DX: Personal history of other diseases of the digestive system: Z87.19

## 2016-07-26 ENCOUNTER — Telehealth: Payer: Self-pay | Admitting: Family Medicine

## 2016-07-26 NOTE — Telephone Encounter (Signed)
Pt returned call to have CPE reschedule due to PCP will be out of the office. Pt says that this is the 4th time. Pt would like to know if PCP is able to see her for CPE sooner than later if possible. Pt says that she has had a baby and a few surgeries since last seeing PCP, she would just like to be sure that everything is okay.    ALSO   Pt would like to be sure that her VYVANSE  Medication go to [facility] [address] [place], [place]: [phone]  She said that she has started back taking medication after having her baby. She would like to be sure that this is okay.    Please advise.

## 2016-07-27 NOTE — Telephone Encounter (Signed)
I am happy to work her in some morning Thursday and Friday mornings work best if she is able I will come in early and see her.

## 2016-07-28 NOTE — Telephone Encounter (Signed)
Pt has been scheduled.  °

## 2016-07-31 ENCOUNTER — Encounter: Payer: Managed Care, Other (non HMO) | Admitting: Family Medicine

## 2016-08-01 ENCOUNTER — Other Ambulatory Visit: Payer: Self-pay | Admitting: Family Medicine

## 2016-08-01 NOTE — Telephone Encounter (Signed)
Requesting: vyvanse Contract  04/06/2016 UDS   none Last OV     05/02/16-----future appointment is on 08/11/2016 Last Refill     #30 no refills on 07/06/2016  Please Advise

## 2016-08-01 NOTE — Telephone Encounter (Signed)
OK to refill Vyvanse #30 and needs a UDS

## 2016-08-01 NOTE — Telephone Encounter (Signed)
Self   Refill request for VYVANSE) 50 MG capsule    CB: [phone]

## 2016-08-02 MED ORDER — LISDEXAMFETAMINE DIMESYLATE 50 MG PO CAPS: 50.0000 mg | ORAL_CAPSULE | Freq: Every day | ORAL | 0 refills | Status: DC

## 2016-08-02 NOTE — Telephone Encounter (Signed)
Rx printed and placed in PCP red folder for signature along with note to collect UDS when pt picks up Rx.

## 2016-08-02 NOTE — Addendum Note (Signed)
Addended by: Kelle Darting A on: 08/02/2016 04:44 PM   Modules accepted: Orders

## 2016-08-03 NOTE — Telephone Encounter (Signed)
Called the patient left a detailed message hardcopy is ready for pickup/update UDS She verbalized understanding.

## 2016-08-07 ENCOUNTER — Encounter: Payer: Managed Care, Other (non HMO) | Admitting: Family Medicine

## 2016-08-10 ENCOUNTER — Encounter: Payer: Managed Care, Other (non HMO) | Admitting: Family Medicine

## 2016-08-11 ENCOUNTER — Ambulatory Visit (INDEPENDENT_AMBULATORY_CARE_PROVIDER_SITE_OTHER): Payer: Managed Care, Other (non HMO) | Admitting: Family Medicine

## 2016-08-11 ENCOUNTER — Encounter: Payer: Self-pay | Admitting: Family Medicine

## 2016-08-11 DIAGNOSIS — Z0000 Encounter for general adult medical examination without abnormal findings: Secondary | ICD-10-CM

## 2016-08-11 DIAGNOSIS — F908 Attention-deficit hyperactivity disorder, other type: Secondary | ICD-10-CM | POA: Diagnosis not present

## 2016-08-11 DIAGNOSIS — D509 Iron deficiency anemia, unspecified: Secondary | ICD-10-CM | POA: Diagnosis not present

## 2016-08-11 DIAGNOSIS — R030 Elevated blood-pressure reading, without diagnosis of hypertension: Secondary | ICD-10-CM

## 2016-08-11 HISTORY — DX: Elevated blood-pressure reading, without diagnosis of hypertension: R03.0

## 2016-08-11 LAB — COMPREHENSIVE METABOLIC PANEL
ALBUMIN: 3.8 g/dL (ref 3.5–5.2)
ALT: 14 U/L (ref 0–35)
AST: 17 U/L (ref 0–37)
Alkaline Phosphatase: 49 U/L (ref 39–117)
BUN: 14 mg/dL (ref 6–23)
CHLORIDE: 105 meq/L (ref 96–112)
CO2: 26 mEq/L (ref 19–32)
CREATININE: 0.89 mg/dL (ref 0.40–1.20)
Calcium: 9.4 mg/dL (ref 8.4–10.5)
GFR: 74.59 mL/min (ref >60.00)
Glucose, Bld: 96 mg/dL (ref 70–99)
Potassium: 3.7 mEq/L (ref 3.5–5.1)
SODIUM: 138 meq/L (ref 135–145)
Total Bilirubin: 0.3 mg/dL (ref 0.2–1.2)
Total Protein: 7.1 g/dL (ref 6.0–8.3)

## 2016-08-11 LAB — CBC
HCT: 37.8 % (ref 36.0–46.0)
Hemoglobin: 11.9 g/dL — ABNORMAL LOW (ref 12.0–15.0)
MCHC: 31.5 g/dL (ref 30.0–36.0)
MCV: 81.5 fl (ref 78.0–100.0)
Platelets: 311 10*3/uL (ref 150.0–400.0)
RBC: 4.64 Mil/uL (ref 3.87–5.11)
RDW: 13.5 % (ref 11.5–15.5)
WBC: 6.7 10*3/uL (ref 4.0–10.5)

## 2016-08-11 LAB — LIPID PANEL
CHOL/HDL RATIO: 3
Cholesterol: 189 mg/dL (ref 0–200)
HDL: 66.7 mg/dL (ref >39.00)
LDL Cholesterol: 85 mg/dL (ref 0–99)
NonHDL: 122.74
TRIGLYCERIDES: 191 mg/dL — AB (ref 0.0–149.0)
VLDL: 38.2 mg/dL (ref 0.0–40.0)

## 2016-08-11 LAB — TSH: TSH: 3.42 u[IU]/mL (ref 0.35–4.50)

## 2016-08-11 MED ORDER — LISDEXAMFETAMINE DIMESYLATE 50 MG PO CAPS: 50.0000 mg | ORAL_CAPSULE | Freq: Every day | ORAL | 0 refills | Status: DC

## 2016-08-11 NOTE — Assessment & Plan Note (Signed)
Given refills on Vyvanse does well with it.

## 2016-08-11 NOTE — Patient Instructions (Signed)
Preventive Care 18-39 Years, Female Preventive care refers to lifestyle choices and visits with your health care provider that can promote health and wellness. What does preventive care include?  A yearly physical exam. This is also called an annual well check.  Dental exams once or twice a year.  Routine eye exams. Ask your health care provider how often you should have your eyes checked.  Personal lifestyle choices, including: ? Daily care of your teeth and gums. ? Regular physical activity. ? Eating a healthy diet. ? Avoiding tobacco and drug use. ? Limiting alcohol use. ? Practicing safe sex. ? Taking vitamin and mineral supplements as recommended by your health care provider. What happens during an annual well check? The services and screenings done by your health care provider during your annual well check will depend on your age, overall health, lifestyle risk factors, and family history of disease. Counseling Your health care provider may ask you questions about your:  Alcohol use.  Tobacco use.  Drug use.  Emotional well-being.  Home and relationship well-being.  Sexual activity.  Eating habits.  Work and work Statistician.  Method of birth control.  Menstrual cycle.  Pregnancy history.  Screening You may have the following tests or measurements:  Height, weight, and BMI.  Diabetes screening. This is done by checking your blood sugar (glucose) after you have not eaten for a while (fasting).  Blood pressure.  Lipid and cholesterol levels. These may be checked every 5 years starting at age 66.  Skin check.  Hepatitis C blood test.  Hepatitis B blood test.  Sexually transmitted disease (STD) testing.  BRCA-related cancer screening. This may be done if you have a family history of breast, ovarian, tubal, or peritoneal cancers.  Pelvic exam and Pap test. This may be done every 3 years starting at age 40. Starting at age 59, this may be done every 5  years if you have a Pap test in combination with an HPV test.  Discuss your test results, treatment options, and if necessary, the need for more tests with your health care provider. Vaccines Your health care provider may recommend certain vaccines, such as:  Influenza vaccine. This is recommended every year.  Tetanus, diphtheria, and acellular pertussis (Tdap, Td) vaccine. You may need a Td booster every 10 years.  Varicella vaccine. You may need this if you have not been vaccinated.  HPV vaccine. If you are 69 or younger, you may need three doses over 6 months.  Measles, mumps, and rubella (MMR) vaccine. You may need at least one dose of MMR. You may also need a second dose.  Pneumococcal 13-valent conjugate (PCV13) vaccine. You may need this if you have certain conditions and were not previously vaccinated.  Pneumococcal polysaccharide (PPSV23) vaccine. You may need one or two doses if you smoke cigarettes or if you have certain conditions.  Meningococcal vaccine. One dose is recommended if you are age 27-21 years and a first-year college student living in a residence hall, or if you have one of several medical conditions. You may also need additional booster doses.  Hepatitis A vaccine. You may need this if you have certain conditions or if you travel or work in places where you may be exposed to hepatitis A.  Hepatitis B vaccine. You may need this if you have certain conditions or if you travel or work in places where you may be exposed to hepatitis B.  Haemophilus influenzae type b (Hib) vaccine. You may need this if  you have certain risk factors.  Talk to your health care provider about which screenings and vaccines you need and how often you need them. This information is not intended to replace advice given to you by your health care provider. Make sure you discuss any questions you have with your health care provider. Document Released: 02/28/2001 Document Revised: 09/22/2015  Document Reviewed: 11/03/2014 Elsevier Interactive Patient Education  2017 [employer].

## 2016-08-11 NOTE — Assessment & Plan Note (Addendum)
Patient encouraged to maintain heart healthy diet, regular exercise, adequate sleep. Consider daily probiotics. Take medications as prescribed. Struggles with sleep can try to alternate L Tryptophan and melatonin and encouraged to get off of night shifts

## 2016-08-11 NOTE — Progress Notes (Signed)
Subjective:  I acted as a Education administrator for Dr. Charlett Blake. [place], [place]  Patient ID: Katelyn [name], female    DOB: [birth year], 41 y.o.   MRN: [id number]  No chief complaint on file.   HPI  Patient is in today for an annual exam. Patient presents with no acute concerns. No recent febrile illness or acute concerns. No recent hospitalizations. She is trying to eat well and stay active. Worked all night and had significant caffeine so her blood pressure is up. She sprained her ankle a couple of weeks ago and saw ortho. It is improving now. She had an umbilical hernia repair in June of 2018 and it is healing well. Small scab still noted. She is waiting for surgery soon with a Tummy tuck, breast lift and some liposuction in the near future. Denies CP/palp/SOB/HA/congestion/fevers/GI or GU c/o. Taking meds as prescribed  Patient Care Team: [name], MD as PCP - General (Family Medicine)   Past Medical History:  Diagnosis Date  . Acute bronchitis 01/03/2015  . ADD (attention deficit disorder)   . Anemia   . Anemia, iron deficiency 07/14/2013  . Chicken pox as a child  . Elevated blood pressure reading 08/11/2016  . Fibroid   . GERD (gastroesophageal reflux disease)   . H/O umbilical hernia repair 37/62/8315  . Preventative health care 07/20/2013    Past Surgical History:  Procedure Laterality Date  . axillary cyst     left side  . MYOMECTOMY  2008  . WISDOM TOOTH EXTRACTION      Family History  Problem Relation Age of Onset  . Thyroid disease Mother   . Diabetes Maternal Grandfather        type 2  . Diabetes Paternal Grandmother        type 2  . Heart attack Paternal Grandfather     Social History   Social History  . Marital status: Married    Spouse name: N/A  . Number of children: N/A  . Years of education: N/A   Occupational History  . Not on file.   Social History Main Topics  . Smoking status: Never Smoker  . Smokeless tobacco: Never Used  . Alcohol use No  .  Drug use: No  . Sexual activity: Yes    Birth control/ protection: None     Comment: lives with husband and daughter, work at [employer]. No dietary restrictions, MD   Other Topics Concern  . Not on file   Social History Narrative  . No narrative on file    Outpatient Medications Prior to Visit  Medication Sig Dispense Refill  . ibuprofen (ADVIL,MOTRIN) 600 MG tablet Take 1 tablet (600 mg total) by mouth every 6 (six) hours. 40 tablet 0  . lisdexamfetamine (VYVANSE) 50 MG capsule Take 1 capsule (50 mg total) by mouth daily. 30 capsule 0  . acetaminophen (TYLENOL) 325 MG tablet Take 650 mg by mouth every 6 (six) hours as needed for moderate pain.    . Prenatal Vit-Fe Fumarate-FA (PRENATAL MULTIVITAMIN) TABS tablet Take 1 tablet by mouth daily at 12 noon.     No facility-administered medications prior to visit.     No Known Allergies  Review of Systems  Constitutional: Positive for malaise/fatigue. Negative for fever.  HENT: Negative for congestion.   Eyes: Negative for blurred vision.  Respiratory: Negative for cough and shortness of breath.   Cardiovascular: Negative for chest pain, palpitations and leg swelling.  Gastrointestinal: Negative for vomiting.  Musculoskeletal: Negative  for back pain.  Skin: Negative for rash.  Neurological: Negative for loss of consciousness and headaches.  Psychiatric/Behavioral: The patient has insomnia.        Objective:    Physical Exam  Constitutional: She is oriented to person, place, and time. She appears well-developed and well-nourished. No distress.  HENT:  Head: Normocephalic and atraumatic.  Eyes: Conjunctivae are normal.  Neck: Normal range of motion. No thyromegaly present.  Cardiovascular: Normal rate and regular rhythm.   Pulmonary/Chest: Effort normal and breath sounds normal. She has no wheezes.  Abdominal: Soft. Bowel sounds are normal. There is no tenderness.  Musculoskeletal: Normal range of motion. She exhibits  no edema or deformity.  Neurological: She is alert and oriented to person, place, and time.  Skin: Skin is warm and dry. She is not diaphoretic.  Psychiatric: She has a normal mood and affect.    BP (!) 164/94   Pulse 78   Temp 97.9 F (36.6 C) (Oral)   Resp 18   Ht 5\' 3"  (1.6 m)   Wt 14 lb 6.4 oz (6.532 kg)   SpO2 99%   BMI 2.55 kg/m  Wt Readings from Last 3 Encounters:  08/11/16 14 lb 6.4 oz (6.532 kg)  08/18/15 167 lb (75.8 kg)  08/16/15 167 lb (75.8 kg)   BP Readings from Last 3 Encounters:  08/11/16 (!) 164/94  05/02/16 122/88  08/19/15 133/80     Immunization History  Administered Date(s) Administered  . Influenza-Unspecified 10/16/2012, 09/17/2014  . Tdap 06/26/2013    Health Maintenance  Topic Date Due  . PAP SMEAR  09/17/2015  . INFLUENZA VACCINE  08/16/2016  . TETANUS/TDAP  06/27/2023  . HIV Screening  Completed    Lab Results  Component Value Date   WBC 6.7 08/11/2016   HGB 11.9 (L) 08/11/2016   HCT 37.8 08/11/2016   PLT 311.0 08/11/2016   GLUCOSE 96 08/11/2016   CHOL 189 08/11/2016   TRIG 191.0 (H) 08/11/2016   HDL 66.70 08/11/2016   LDLCALC 85 08/11/2016   ALT 14 08/11/2016   AST 17 08/11/2016   NA 138 08/11/2016   K 3.7 08/11/2016   CL 105 08/11/2016   CREATININE 0.89 08/11/2016   BUN 14 08/11/2016   CO2 26 08/11/2016   TSH 3.42 08/11/2016    Lab Results  Component Value Date   TSH 3.42 08/11/2016   Lab Results  Component Value Date   WBC 6.7 08/11/2016   HGB 11.9 (L) 08/11/2016   HCT 37.8 08/11/2016   MCV 81.5 08/11/2016   PLT 311.0 08/11/2016   Lab Results  Component Value Date   NA 138 08/11/2016   K 3.7 08/11/2016   CO2 26 08/11/2016   GLUCOSE 96 08/11/2016   BUN 14 08/11/2016   CREATININE 0.89 08/11/2016   BILITOT 0.3 08/11/2016   ALKPHOS 49 08/11/2016   AST 17 08/11/2016   ALT 14 08/11/2016   PROT 7.1 08/11/2016   ALBUMIN 3.8 08/11/2016   CALCIUM 9.4 08/11/2016   GFR 74.59 08/11/2016   Lab Results    Component Value Date   CHOL 189 08/11/2016   Lab Results  Component Value Date   HDL 66.70 08/11/2016   Lab Results  Component Value Date   LDLCALC 85 08/11/2016   Lab Results  Component Value Date   TRIG 191.0 (H) 08/11/2016   Lab Results  Component Value Date   CHOLHDL 3 08/11/2016   No results found for: HGBA1C  Assessment & Plan:   Problem List Items Addressed This Visit    Anemia, iron deficiency    Increase leafy greens, consider increased lean red meat and using cast iron cookware. Continue to monitor, report any concerns.      Preventative health care    Patient encouraged to maintain heart healthy diet, regular exercise, adequate sleep. Consider daily probiotics. Take medications as prescribed. Struggles with sleep can try to alternate L Tryptophan and melatonin and encouraged to get off of night shifts      Relevant Orders   Lipid panel (Completed)   CBC (Completed)   Comprehensive metabolic panel (Completed)   TSH (Completed)   ADD (attention deficit disorder)    Given refills on Vyvanse does well with it.       Elevated blood pressure reading     Did work in ER all night and drank caffeine. Will come in for BP check rested in 2 weeks. No caffeine         I have discontinued Ms. Aquilar's prenatal multivitamin and acetaminophen. I am also having her start on lisdexamfetamine and lisdexamfetamine. Additionally, I am having her maintain her ibuprofen, etonogestrel-ethinyl estradiol, and lisdexamfetamine.  Meds ordered this encounter  Medications  . etonogestrel-ethinyl estradiol (NUVARING) 0.12-0.015 MG/24HR vaginal ring    Sig: Place 1 Device vaginally every 30 (thirty) days.  Marland Kitchen lisdexamfetamine (VYVANSE) 50 MG capsule    Sig: Take 1 capsule (50 mg total) by mouth daily.    Dispense:  30 capsule    Refill:  0    August 2018  . lisdexamfetamine (VYVANSE) 50 MG capsule    Sig: Take 1 capsule (50 mg total) by mouth daily.    Dispense:  30  capsule    Refill:  0    September 2018  . lisdexamfetamine (VYVANSE) 50 MG capsule    Sig: Take 1 capsule (50 mg total) by mouth daily.    Dispense:  30 capsule    Refill:  0    October 2018    CMA served as Education administrator during this visit. History, Physical and Plan performed by medical provider. Documentation and orders reviewed and attested to.  Penni Homans, MD

## 2016-08-11 NOTE — Assessment & Plan Note (Signed)
Increase leafy greens, consider increased lean red meat and using cast iron cookware. Continue to monitor, report any concerns 

## 2016-08-11 NOTE — Assessment & Plan Note (Addendum)
Did work in [place] all night and drank caffeine. Will come in for BP check rested in 2 weeks. No caffeine

## 2016-08-22 ENCOUNTER — Ambulatory Visit (INDEPENDENT_AMBULATORY_CARE_PROVIDER_SITE_OTHER): Payer: Managed Care, Other (non HMO) | Admitting: Family Medicine

## 2016-08-22 VITALS — BP 141/86 | HR 84

## 2016-08-22 DIAGNOSIS — R030 Elevated blood-pressure reading, without diagnosis of hypertension: Secondary | ICD-10-CM

## 2016-08-22 NOTE — Patient Instructions (Signed)
Per Dr. Charlett Blake: Begin keeping a daily log of blood pressure readings; if you notice that your blood pressure is spiking/trending up again (e.g. 150-160's/90's), please call the office. Return in 2-3 months for an office visit with PCP.

## 2016-08-22 NOTE — Progress Notes (Signed)
Pre visit review using our clinic review tool, if applicable. No additional management support is needed unless otherwise documented below in the visit note.  Patient presents in office for blood pressure check per OV note 08/11/16. Currently, the patient does not take any medications for blood pressure. RN obtained the following readings during today's visit: BP 150/95 P 82 & BP 141/86 P 84. Patient is asymptomatic; she denies chest pain, dizziness, lightheadedness, & headaches.   Per Dr. Charlett Blake: Begin keeping a daily log of blood pressure readings; if you notice that your blood pressure is spiking/trending up again (e.g. 150-160's/90's), please call the office. Return in 2-3 months for an office visit with PCP.  Informed patient of the provider's recommendations. She verbalized understanding. Next appointment scheduled for 11/23/16 at 4:00 PM.  RN blood pressure check note reviewed. Agree with documention and plan.

## 2016-08-22 NOTE — Progress Notes (Signed)
RN blood pressure check note reviewed. Agree with documention and plan. 

## 2016-10-24 ENCOUNTER — Telehealth: Payer: Self-pay | Admitting: Family Medicine

## 2016-10-24 NOTE — Telephone Encounter (Signed)
°  Relation to CE:YEMV Call back number:[phone] Pharmacy:  Reason for call:  Patient is having surgery 11/01/16 and the surgeon prescribing re prescribing oxycodone for pain and valium for musle spams patient states due to her [place] she wanted to inform Dr.  Charlett Blake before filling, please advise

## 2016-10-25 NOTE — Telephone Encounter (Signed)
Thanks for the update. Good luck with surgery and OK to use the medications as needed.

## 2016-10-27 NOTE — Telephone Encounter (Signed)
Left detailed message on patient vmail about message below.   PC

## 2016-11-20 ENCOUNTER — Other Ambulatory Visit: Payer: Self-pay | Admitting: Family Medicine

## 2016-11-20 NOTE — Telephone Encounter (Signed)
°  Relation to TW:KMQK Call back number:[phone] Pharmacy: [facility] [place]  Reason for call:  Patient requesting lisdexamfetamine (VYVANSE) 50 MG capsule, patient states daughter has appointment and would like to pick up Rx tomorrow, please advise

## 2016-11-21 ENCOUNTER — Other Ambulatory Visit: Payer: Self-pay | Admitting: Family Medicine

## 2016-11-21 MED ORDER — LISDEXAMFETAMINE DIMESYLATE 50 MG PO CAPS: 50.0000 mg | ORAL_CAPSULE | Freq: Every day | ORAL | 0 refills | Status: DC

## 2016-11-21 NOTE — Telephone Encounter (Signed)
Requesting:vyvanse nov, dec 2018 & Jan 2019 Contract:yes UDS:low risk next screen 02/02/17 Last OV:08/22/16 Next OV:11/23/16 Last Refill:08/11/16 Aug, sept, oct #[id number]   Please advise

## 2016-11-21 NOTE — Telephone Encounter (Signed)
Medication sent to patient

## 2016-11-23 ENCOUNTER — Encounter: Payer: Self-pay | Admitting: Family Medicine

## 2016-11-23 ENCOUNTER — Ambulatory Visit: Payer: Managed Care, Other (non HMO) | Admitting: Family Medicine

## 2016-11-23 DIAGNOSIS — L309 Dermatitis, unspecified: Secondary | ICD-10-CM | POA: Diagnosis not present

## 2016-11-23 DIAGNOSIS — R030 Elevated blood-pressure reading, without diagnosis of hypertension: Secondary | ICD-10-CM | POA: Diagnosis not present

## 2016-11-23 NOTE — Patient Instructions (Addendum)
Tylenol/Acetaminophen ES 500 mg tabs, 2 tabs twice daily Curcumen daily [email or web address] Topical lidocaine for pain   Miracle Oil cream at everything hemp store, moist gloves If no improvement call for .  Hypertension Hypertension is another name for high blood pressure. High blood pressure forces your heart to work harder to pump blood. This can cause problems over time. There are two numbers in a blood pressure reading. There is a top number (systolic) over a bottom number (diastolic). It is best to have a blood pressure below 120/80. Healthy choices can help lower your blood pressure. You may need medicine to help lower your blood pressure if:  Your blood pressure cannot be lowered with healthy choices.  Your blood pressure is higher than 130/80.  Follow these instructions at home: Eating and drinking  If directed, follow the DASH eating plan. This diet includes: ? Filling half of your plate at each meal with fruits and vegetables. ? Filling one quarter of your plate at each meal with whole grains. Whole grains include whole wheat pasta, brown rice, and whole grain bread. ? Eating or drinking low-fat dairy products, such as skim milk or low-fat yogurt. ? Filling one quarter of your plate at each meal with low-fat (lean) proteins. Low-fat proteins include fish, skinless chicken, eggs, beans, and tofu. ? Avoiding fatty meat, cured and processed meat, or chicken with skin. ? Avoiding premade or processed food.  Eat less than 1,500 mg of salt (sodium) a day.  Limit alcohol use to no more than 1 drink a day for nonpregnant women and 2 drinks a day for men. One drink equals 12 oz of beer, 5 oz of wine, or 1 oz of hard liquor. Lifestyle  Work with your doctor to stay at a healthy weight or to lose weight. Ask your doctor what the best weight is for you.  Get at least 30 minutes of exercise that causes your heart to beat faster (aerobic exercise) most days of the week. This may include  walking, swimming, or biking.  Get at least 30 minutes of exercise that strengthens your muscles (resistance exercise) at least 3 days a week. This may include lifting weights or pilates.  Do not use any products that contain nicotine or tobacco. This includes cigarettes and e-cigarettes. If you need help quitting, ask your doctor.  Check your blood pressure at home as told by your doctor.  Keep all follow-up visits as told by your doctor. This is important. Medicines  Take over-the-counter and prescription medicines only as told by your doctor. Follow directions carefully.  Do not skip doses of blood pressure medicine. The medicine does not work as well if you skip doses. Skipping doses also puts you at risk for problems.  Ask your doctor about side effects or reactions to medicines that you should watch for. Contact a doctor if:  You think you are having a reaction to the medicine you are taking.  You have headaches that keep coming back (recurring).  You feel dizzy.  You have swelling in your ankles.  You have trouble with your vision. Get help right away if:  You get a very bad headache.  You start to feel confused.  You feel weak or numb.  You feel faint.  You get very bad pain in your: ? Chest. ? Belly (abdomen).  You throw up (vomit) more than once.  You have trouble breathing. Summary  Hypertension is another name for high blood pressure.  Making healthy choices can help  lower blood pressure. If your blood pressure cannot be controlled with healthy choices, you may need to take medicine. This information is not intended to replace advice given to you by your health care provider. Make sure you discuss any questions you have with your health care provider. Document Released: 06/21/2007 Document Revised: 12/01/2015 Document Reviewed: 12/01/2015 Elsevier Interactive Patient Education  [employer].

## 2016-11-23 NOTE — Progress Notes (Signed)
Subjective:  I acted as a Education administrator for [employer]. Yancey Flemings, [place]   Patient ID: Katelyn [name], female    DOB: July 24, 1975, 41 y.o.   MRN: [id number]  Chief Complaint  Patient presents with  . Follow-up    HPI  Patient is in today for follow up visit and is feeling well today. No recent febrile illness or hospitalizations. She has had an umbilical hernia repair, breast and tummy tuck since last seen. Also had a right ankle fracture so has not been exercising. Her bp has been borderline but she feels well. Denies CP/palp/SOB/HA/congestion/fevers/GI or GU c/o. Taking meds as prescribed  Patient Care Team: [name], MD as PCP - General (Family Medicine)   Past Medical History:  Diagnosis Date  . Acute bronchitis 01/03/2015  . ADD (attention deficit disorder)   . Anemia   . Anemia, iron deficiency 07/14/2013  . Chicken pox as a child  . Elevated blood pressure reading 08/11/2016  . Fibroid   . GERD (gastroesophageal reflux disease)   . H/O umbilical hernia repair 93/73/4287  . Preventative health care 07/20/2013    Past Surgical History:  Procedure Laterality Date  . axillary cyst     left side  . MYOMECTOMY  2008  . WISDOM TOOTH EXTRACTION      Family History  Problem Relation Age of Onset  . Thyroid disease Mother   . Diabetes Maternal Grandfather        type 2  . Diabetes Paternal Grandmother        type 2  . Heart attack Paternal Grandfather     Social History   Socioeconomic History  . Marital status: Married    Spouse name: Not on file  . Number of children: Not on file  . Years of education: Not on file  . Highest education level: Not on file  Social Needs  . Financial resource strain: Not on file  . Food insecurity - worry: Not on file  . Food insecurity - inability: Not on file  . Transportation needs - medical: Not on file  . Transportation needs - non-medical: Not on file  Occupational History  . Not on file  Tobacco Use  . Smoking status: Never  Smoker  . Smokeless tobacco: Never Used  Substance and Sexual Activity  . Alcohol use: No  . Drug use: No  . Sexual activity: Yes    Birth control/protection: None    Comment: lives with husband and daughter, work at [employer]. No dietary restrictions, [place]  Other Topics Concern  . Not on file  Social History Narrative  . Not on file    Outpatient Medications Prior to Visit  Medication Sig Dispense Refill  . etonogestrel-ethinyl estradiol (NUVARING) 0.12-0.015 MG/24HR vaginal ring Place 1 Device vaginally every 30 (thirty) days.    Marland Kitchen ibuprofen (ADVIL,MOTRIN) 600 MG tablet Take 1 tablet (600 mg total) by mouth every 6 (six) hours. 40 tablet 0  . lisdexamfetamine (VYVANSE) 50 MG capsule Take 1 capsule (50 mg total) daily by mouth. 30 capsule 0  . lisdexamfetamine (VYVANSE) 50 MG capsule Take 1 capsule (50 mg total) daily by mouth. 30 capsule 0  . lisdexamfetamine (VYVANSE) 50 MG capsule Take 1 capsule (50 mg total) daily by mouth. 30 capsule 0   No facility-administered medications prior to visit.     No Known Allergies  Review of Systems  Constitutional: Negative for fever and malaise/fatigue.  HENT: Negative for congestion.   Respiratory: Negative for cough and  shortness of breath.   Cardiovascular: Negative for chest pain and palpitations.  Gastrointestinal: Negative for vomiting.  Musculoskeletal: Negative for back pain.  Skin: Negative for rash.  Neurological: Negative for loss of consciousness and headaches.       Objective:    Physical Exam  There were no vitals taken for this visit. Wt Readings from Last 3 Encounters:  08/11/16 14 lb 6.4 oz (6.532 kg)  08/18/15 167 lb (75.8 kg)  08/16/15 167 lb (75.8 kg)   BP Readings from Last 3 Encounters:  08/22/16 (!) 141/86  08/11/16 (!) 164/94  05/02/16 122/88     Immunization History  Administered Date(s) Administered  . Influenza-Unspecified 10/16/2012, 09/17/2014  . Tdap 06/26/2013    Health Maintenance    Topic Date Due  . PAP SMEAR  09/17/2015  . INFLUENZA VACCINE  08/16/2016  . TETANUS/TDAP  06/27/2023  . HIV Screening  Completed    Lab Results  Component Value Date   WBC 6.7 08/11/2016   HGB 11.9 (L) 08/11/2016   HCT 37.8 08/11/2016   PLT 311.0 08/11/2016   GLUCOSE 96 08/11/2016   CHOL 189 08/11/2016   TRIG 191.0 (H) 08/11/2016   HDL 66.70 08/11/2016   LDLCALC 85 08/11/2016   ALT 14 08/11/2016   AST 17 08/11/2016   NA 138 08/11/2016   K 3.7 08/11/2016   CL 105 08/11/2016   CREATININE 0.89 08/11/2016   BUN 14 08/11/2016   CO2 26 08/11/2016   TSH 3.42 08/11/2016    Lab Results  Component Value Date   TSH 3.42 08/11/2016   Lab Results  Component Value Date   WBC 6.7 08/11/2016   HGB 11.9 (L) 08/11/2016   HCT 37.8 08/11/2016   MCV 81.5 08/11/2016   PLT 311.0 08/11/2016   Lab Results  Component Value Date   NA 138 08/11/2016   K 3.7 08/11/2016   CO2 26 08/11/2016   GLUCOSE 96 08/11/2016   BUN 14 08/11/2016   CREATININE 0.89 08/11/2016   BILITOT 0.3 08/11/2016   ALKPHOS 49 08/11/2016   AST 17 08/11/2016   ALT 14 08/11/2016   PROT 7.1 08/11/2016   ALBUMIN 3.8 08/11/2016   CALCIUM 9.4 08/11/2016   GFR 74.59 08/11/2016   Lab Results  Component Value Date   CHOL 189 08/11/2016   Lab Results  Component Value Date   HDL 66.70 08/11/2016   Lab Results  Component Value Date   LDLCALC 85 08/11/2016   Lab Results  Component Value Date   TRIG 191.0 (H) 08/11/2016   Lab Results  Component Value Date   CHOLHDL 3 08/11/2016   No results found for: HGBA1C       Assessment & Plan:   Problem List Items Addressed This Visit    None      I am having [name] maintain her ibuprofen, etonogestrel-ethinyl estradiol, lisdexamfetamine, lisdexamfetamine, and lisdexamfetamine.  No orders of the defined types were placed in this encounter.   CMA served as Education administrator during this visit. History, Physical and Plan performed by medical provider.  Documentation and orders reviewed and attested to.  Bartholome Bill, RMA

## 2016-11-27 DIAGNOSIS — L309 Dermatitis, unspecified: Secondary | ICD-10-CM | POA: Insufficient documentation

## 2016-11-27 NOTE — Assessment & Plan Note (Signed)
Has been running 120s to 130 over 80s to 90s. She feels well. Encouraged to minimize caffeine and sodium. Get adequate sleep and keep stress down recheck at next visit and she will notify us if worsens.

## 2016-11-27 NOTE — Assessment & Plan Note (Signed)
Has rash on both hands due to excessive washing at her job. She notes a steroid cream she had caused it to burn. She is to avoid harsh soaps and try Miracle Oil Cream if no improvement may need a different steroid. Also use moist, cotton gloves.

## 2017-02-12 ENCOUNTER — Ambulatory Visit: Payer: Managed Care, Other (non HMO) | Admitting: Family Medicine

## 2017-02-12 ENCOUNTER — Other Ambulatory Visit: Payer: Self-pay | Admitting: Family Medicine

## 2017-02-12 DIAGNOSIS — Z79899 Other long term (current) drug therapy: Secondary | ICD-10-CM

## 2017-02-12 MED ORDER — LISDEXAMFETAMINE DIMESYLATE 50 MG PO CAPS: 50.0000 mg | ORAL_CAPSULE | Freq: Every day | ORAL | 0 refills | Status: DC

## 2017-02-12 NOTE — Telephone Encounter (Signed)
Ok to refill repeat UDS at upcoming visit

## 2017-02-12 NOTE — Telephone Encounter (Signed)
Copied from [place] [phone]. Topic: Quick Communication - See Telephone Encounter >> Feb 12, 2017  6:52 AM Rosalin Hawking wrote: CRM for notification. See Telephone encounter for:  02/12/17.   Pt requesting rx for lisdexamfetamine (VYVANSE) 50 MG capsule. Please advise.

## 2017-02-12 NOTE — Telephone Encounter (Signed)
Requesting:vyvanse Contract:yes UDS:low risk next screen 02/02/17 Last OV:11/20/16 Next OV:02/27/17 Last Refill:11/21/16 for Nov. Dec. Jan #[id number]   Please advise

## 2017-02-13 NOTE — Addendum Note (Signed)
Addended by: Magdalene Molly A on: 02/13/2017 08:01 AM   Modules accepted: Orders

## 2017-02-13 NOTE — Telephone Encounter (Signed)
Patient notified

## 2017-02-22 ENCOUNTER — Other Ambulatory Visit: Payer: Self-pay | Admitting: Family Medicine

## 2017-02-22 MED ORDER — LISDEXAMFETAMINE DIMESYLATE 50 MG PO CAPS: 50.0000 mg | ORAL_CAPSULE | Freq: Every day | ORAL | 0 refills | Status: DC

## 2017-02-27 ENCOUNTER — Ambulatory Visit: Payer: Managed Care, Other (non HMO) | Admitting: Family Medicine

## 2017-03-19 ENCOUNTER — Encounter: Payer: Self-pay | Admitting: Family Medicine

## 2017-03-19 ENCOUNTER — Ambulatory Visit: Payer: Managed Care, Other (non HMO) | Admitting: Family Medicine

## 2017-03-19 VITALS — BP 148/90 | HR 92 | Temp 98.0°F | Resp 18 | Wt 151.0 lb

## 2017-03-19 DIAGNOSIS — I10 Essential (primary) hypertension: Secondary | ICD-10-CM

## 2017-03-19 DIAGNOSIS — E785 Hyperlipidemia, unspecified: Secondary | ICD-10-CM | POA: Diagnosis not present

## 2017-03-19 DIAGNOSIS — R000 Tachycardia, unspecified: Secondary | ICD-10-CM | POA: Insufficient documentation

## 2017-03-19 DIAGNOSIS — D509 Iron deficiency anemia, unspecified: Secondary | ICD-10-CM | POA: Diagnosis not present

## 2017-03-19 DIAGNOSIS — L659 Nonscarring hair loss, unspecified: Secondary | ICD-10-CM | POA: Diagnosis not present

## 2017-03-19 DIAGNOSIS — D649 Anemia, unspecified: Secondary | ICD-10-CM | POA: Diagnosis not present

## 2017-03-19 DIAGNOSIS — F988 Other specified behavioral and emotional disorders with onset usually occurring in childhood and adolescence: Secondary | ICD-10-CM | POA: Diagnosis not present

## 2017-03-19 MED ORDER — LISDEXAMFETAMINE DIMESYLATE 50 MG PO CAPS: 50.0000 mg | ORAL_CAPSULE | Freq: Every day | ORAL | 0 refills | Status: DC

## 2017-03-19 MED ORDER — METOPROLOL SUCCINATE ER 25 MG PO TB24: 12.5000 mg | ORAL_TABLET | Freq: Every day | ORAL | 3 refills | Status: DC

## 2017-03-19 NOTE — Assessment & Plan Note (Signed)
Feels Vyvanse is helping is helping her concentration. Given refills today and uds updated

## 2017-03-19 NOTE — Progress Notes (Signed)
Subjective:  I acted as a Education administrator for Dr. Charlett Blake. [place], [place]  Patient ID: Katelyn [name], female    DOB: 1975/04/10, 42 y.o.   MRN: [id number]  No chief complaint on file.   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 She notes some recent increase in hair loss and dry skin as well as fatigue. She is hoping to have labs run today. Denies CP/palp/SOB/HA/congestion/fevers/GI or GU c/o. Taking meds as prescribed  Patient Care Team: [name], MD as PCP - General (Family Medicine)   Past Medical History:  Diagnosis Date  . Acute bronchitis 01/03/2015  . ADD (attention deficit disorder)   . Anemia   . Anemia, iron deficiency 07/14/2013  . Chicken pox as a child  . Elevated blood pressure reading 08/11/2016  . Fibroid   . GERD (gastroesophageal reflux disease)   . H/O umbilical hernia repair 40/10/2723  . Preventative health care 07/20/2013    Past Surgical History:  Procedure Laterality Date  . axillary cyst     left side  . MYOMECTOMY  2008  . WISDOM TOOTH EXTRACTION      Family History  Problem Relation Age of Onset  . Thyroid disease Mother   . Diabetes Maternal Grandfather        type 2  . Diabetes Paternal Grandmother        type 2  . Heart attack Paternal Grandfather     Social History   Socioeconomic History  . Marital status: Married    Spouse name: Not on file  . Number of children: Not on file  . Years of education: Not on file  . Highest education level: Not on file  Social Needs  . Financial resource strain: Not on file  . Food insecurity - worry: Not on file  . Food insecurity - inability: Not on file  . Transportation needs - medical: Not on file  . Transportation needs - non-medical: Not on file  Occupational History  . Not on file  Tobacco Use  . Smoking status: Never  Smoker  . Smokeless tobacco: Never Used  Substance and Sexual Activity  . Alcohol use: No  . Drug use: No  . Sexual activity: Yes    Birth control/protection: None    Comment: lives with husband and daughter, work at [employer]. No dietary restrictions, MD  Other Topics Concern  . Not on file  Social History Narrative  . Not on file    Outpatient Medications Prior to Visit  Medication Sig Dispense Refill  . etonogestrel-ethinyl estradiol (NUVARING) 0.12-0.015 MG/24HR vaginal ring Place 1 Device vaginally every 30 (thirty) days.    Marland Kitchen ibuprofen (ADVIL,MOTRIN) 600 MG tablet Take 1 tablet (600 mg total) by mouth every 6 (six) hours. 40 tablet 0  . lisdexamfetamine (VYVANSE) 50 MG capsule Take 1 capsule (50 mg total) by mouth daily. 30 capsule 0  . lisdexamfetamine (VYVANSE) 50 MG capsule Take 1 capsule (50 mg total) by mouth daily. 30 capsule 0  . lisdexamfetamine (VYVANSE) 50 MG capsule Take 1 capsule (50 mg total) by mouth daily. 30 capsule 0   No facility-administered medications prior to visit.     No Known Allergies  Review of Systems  Constitutional: Negative for fever and malaise/fatigue.  HENT: Negative for congestion.   Eyes:  Negative for blurred vision.  Respiratory: Negative for shortness of breath.   Cardiovascular: Negative for chest pain, palpitations and leg swelling.  Gastrointestinal: Negative for abdominal pain, blood in stool and nausea.  Genitourinary: Negative for dysuria and frequency.  Musculoskeletal: Negative for falls.  Skin: Negative for rash.  Neurological: Negative for dizziness, loss of consciousness and headaches.  Endo/Heme/Allergies: Negative for environmental allergies.  Psychiatric/Behavioral: Negative for depression. The patient is not nervous/anxious.        Objective:    Physical Exam  BP (!) 148/90 (BP Location: Left Arm, Patient Position: Sitting, Cuff Size: Normal)   Pulse 92   Temp 98 F (36.7 C) (Oral)   Resp 18   Wt 151  lb (68.5 kg)   SpO2 99%   BMI 26.75 kg/m  Wt Readings from Last 3 Encounters:  03/19/17 151 lb (68.5 kg)  11/23/16 147 lb 12.8 oz (67 kg)  08/11/16 14 lb 6.4 oz (6.532 kg)   BP Readings from Last 3 Encounters:  03/19/17 (!) 148/90  11/23/16 (!) 124/92  08/22/16 (!) 141/86     Immunization History  Administered Date(s) Administered  . Influenza-Unspecified 10/16/2012, 09/17/2014  . Tdap 06/26/2013    Health Maintenance  Topic Date Due  . PAP SMEAR  09/17/2015  . TETANUS/TDAP  06/27/2023  . INFLUENZA VACCINE  Completed  . HIV Screening  Completed    Lab Results  Component Value Date   WBC 6.7 08/11/2016   HGB 11.9 (L) 08/11/2016   HCT 37.8 08/11/2016   PLT 311.0 08/11/2016   GLUCOSE 96 08/11/2016   CHOL 189 08/11/2016   TRIG 191.0 (H) 08/11/2016   HDL 66.70 08/11/2016   LDLCALC 85 08/11/2016   ALT 14 08/11/2016   AST 17 08/11/2016   NA 138 08/11/2016   K 3.7 08/11/2016   CL 105 08/11/2016   CREATININE 0.89 08/11/2016   BUN 14 08/11/2016   CO2 26 08/11/2016   TSH 3.42 08/11/2016    Lab Results  Component Value Date   TSH 3.42 08/11/2016   Lab Results  Component Value Date   WBC 6.7 08/11/2016   HGB 11.9 (L) 08/11/2016   HCT 37.8 08/11/2016   MCV 81.5 08/11/2016   PLT 311.0 08/11/2016   Lab Results  Component Value Date   NA 138 08/11/2016   K 3.7 08/11/2016   CO2 26 08/11/2016   GLUCOSE 96 08/11/2016   BUN 14 08/11/2016   CREATININE 0.89 08/11/2016   BILITOT 0.3 08/11/2016   ALKPHOS 49 08/11/2016   AST 17 08/11/2016   ALT 14 08/11/2016   PROT 7.1 08/11/2016   ALBUMIN 3.8 08/11/2016   CALCIUM 9.4 08/11/2016   GFR 74.59 08/11/2016   Lab Results  Component Value Date   CHOL 189 08/11/2016   Lab Results  Component Value Date   HDL 66.70 08/11/2016   Lab Results  Component Value Date   LDLCALC 85 08/11/2016   Lab Results  Component Value Date   TRIG 191.0 (H) 08/11/2016   Lab Results  Component Value Date   CHOLHDL 3 08/11/2016     No results found for: HGBA1C       Assessment & Plan:   Problem List Items Addressed This Visit    Anemia, iron deficiency    Increase leafy greens, consider increased lean red meat and using cast iron cookware. Continue to monitor, report any concerns, check cbc      ADD (attention deficit disorder)    Feels Vyvanse is  helping is helping her concentration. Given refills today and uds updated      Relevant Medications   lisdexamfetamine (VYVANSE) 50 MG capsule   lisdexamfetamine (VYVANSE) 50 MG capsule   lisdexamfetamine (VYVANSE) 50 MG capsule   Other Relevant Orders   Pain Mgmt, Profile 8 w/Conf, U   Hair loss    Increase hydration, Zinc, biotin, krill oil caps      Hyperlipidemia - Primary    Mild elevation of Triglycerides recheck lipid panel      Relevant Medications   metoprolol succinate (TOPROL-XL) 25 MG 24 hr tablet   Other Relevant Orders   Lipid panel   Tachycardia    Started on Metoprolol XL 25 mg tab, 1/2 to 1 tab daily       Other Visit Diagnoses    Anemia, unspecified type       Essential hypertension       Relevant Medications   metoprolol succinate (TOPROL-XL) 25 MG 24 hr tablet   Other Relevant Orders   CBC   Comprehensive metabolic panel   TSH   T4, free      I am having Katelyn [name] start on metoprolol succinate. I am also having her maintain her ibuprofen, etonogestrel-ethinyl estradiol, lisdexamfetamine, lisdexamfetamine, and lisdexamfetamine.  Meds ordered this encounter  Medications  . lisdexamfetamine (VYVANSE) 50 MG capsule    Sig: Take 1 capsule (50 mg total) by mouth daily.    Dispense:  30 capsule    Refill:  0    April 2019  . lisdexamfetamine (VYVANSE) 50 MG capsule    Sig: Take 1 capsule (50 mg total) by mouth daily.    Dispense:  30 capsule    Refill:  0    March 2019  . lisdexamfetamine (VYVANSE) 50 MG capsule    Sig: Take 1 capsule (50 mg total) by mouth daily.    Dispense:  30 capsule    Refill:  0    May  2019  . metoprolol succinate (TOPROL-XL) 25 MG 24 hr tablet    Sig: Take 0.5-1 tablets (12.5-25 mg total) by mouth daily.    Dispense:  30 tablet    Refill:  3    CMA served as scribe during this visit. History, Physical and Plan performed by medical provider. Documentation and orders reviewed and attested to.  Penni Homans, MD

## 2017-03-19 NOTE — Assessment & Plan Note (Signed)
Increase leafy greens, consider increased lean red meat and using cast iron cookware. Continue to monitor, report any concerns, check cbc

## 2017-03-19 NOTE — Assessment & Plan Note (Signed)
Increase hydration, Zinc, biotin, krill oil caps

## 2017-03-19 NOTE — Assessment & Plan Note (Signed)
Mild elevation of Triglycerides recheck lipid panel

## 2017-03-19 NOTE — Patient Instructions (Signed)
Sinus Tachycardia °Sinus tachycardia is a kind of fast heartbeat. In sinus tachycardia, the heart beats more than 100 times a minute. Sinus tachycardia starts in a part of the heart called the sinus node. Sinus tachycardia may be harmless, or it may be a sign of a serious condition. °What are the causes? °This condition may be caused by: °· Exercise or exertion. °· A fever. °· Pain. °· Loss of body fluids (dehydration). °· Severe bleeding (hemorrhage). °· Anxiety and stress. °· Certain substances, including: °? Alcohol. °? Caffeine. °? Tobacco and nicotine products. °? Diet pills. °? Illegal drugs. °· Medical conditions including: °? Heart disease. °? An infection. °? An overactive thyroid (hyperthyroidism). °? A lack of red blood cells (anemia). ° °What are the signs or symptoms? °Symptoms of this condition include: °· A feeling that the heart is beating quickly (palpitations). °· Suddenly noticing your heartbeat (cardiac awareness). °· Dizziness. °· Tiredness (fatigue). °· Shortness of breath. °· Chest pain. °· Nausea. °· Fainting. ° °How is this diagnosed? °This condition is diagnosed with: °· A physical exam. °· Other tests, such as: °? Blood tests. °? An electrocardiogram (ECG). This test measures the electrical activity of the heart. °? Holter monitoring. For this test, you wear a device that records your heartbeat for one or more days. ° °You may be referred to a heart specialist (cardiologist). °How is this treated? °Treatment for this condition depends on the cause or underlying condition. Treatment may involve: °· Treating the underlying condition. °· Taking new medicines or changing your current medicines as told by your health care provider. °· Making changes to your diet or lifestyle. °· Practicing relaxation methods. ° °Follow these instructions at home: °Lifestyle °· Do not use any products that contain nicotine or tobacco, such as cigarettes and e-cigarettes. If you need help quitting, ask your  health care provider. °· Learn relaxation methods, like deep breathing, to help you when you get stressed or anxious. °· Do not use illegal drugs, such as cocaine. °· Do not abuse alcohol. Limit alcohol intake to no more than 1 drink a day for non-pregnant women and 2 drinks a day for men. One drink equals 12 oz of beer, 5 oz of wine, or 1½ oz of hard liquor. °· Find time to rest and relax often. This reduces stress. °· Avoid: °? Caffeine. °? Stimulants such as over-the-counter diet pills or pills that help you to stay awake. °? Situations that cause anxiety or stress. °General instructions °· Drink enough fluids to keep your urine clear or pale yellow. °· Take over-the-counter and prescription medicines only as told by your health care provider. °· Keep all follow-up visits as told by your health care provider. This is important. °Contact a health care provider if: °· You have a fever. °· You have vomiting or diarrhea that keeps happening (is persistent). °Get help right away if: °· You have pain in your chest, upper arms, jaw, or neck. °· You become weak or dizzy. °· You feel faint. °· You have palpitations that do not go away. °This information is not intended to replace advice given to you by your health care provider. Make sure you discuss any questions you have with your health care provider. °Document Released: 02/10/2004 Document Revised: 07/31/2015 Document Reviewed: 07/17/2014 °Elsevier Interactive Patient Education © 2018 [employer] ° °

## 2017-03-19 NOTE — Assessment & Plan Note (Signed)
Started on Metoprolol XL 25 mg tab, 1/2 to 1 tab daily

## 2017-03-20 LAB — CBC
HCT: 37.2 % (ref 36.0–46.0)
HEMOGLOBIN: 11.9 g/dL — AB (ref 12.0–15.0)
MCHC: 32.1 g/dL (ref 30.0–36.0)
MCV: 72.5 fl — ABNORMAL LOW (ref 78.0–100.0)
Platelets: 354 10*3/uL (ref 150.0–400.0)
RBC: 5.12 Mil/uL — ABNORMAL HIGH (ref 3.87–5.11)
RDW: 16.2 % — AB (ref 11.5–15.5)
WBC: 7 10*3/uL (ref 4.0–10.5)

## 2017-03-20 LAB — COMPREHENSIVE METABOLIC PANEL
ALBUMIN: 3.9 g/dL (ref 3.5–5.2)
ALK PHOS: 45 U/L (ref 39–117)
ALT: 12 U/L (ref 0–35)
AST: 14 U/L (ref 0–37)
BILIRUBIN TOTAL: 0.2 mg/dL (ref 0.2–1.2)
BUN: 14 mg/dL (ref 6–23)
CO2: 26 mEq/L (ref 19–32)
Calcium: 9.4 mg/dL (ref 8.4–10.5)
Chloride: 105 mEq/L (ref 96–112)
Creatinine, Ser: 0.86 mg/dL (ref 0.40–1.20)
GFR: 77.36 mL/min (ref >60.00)
GLUCOSE: 88 mg/dL (ref 70–99)
Potassium: 4 mEq/L (ref 3.5–5.1)
SODIUM: 138 meq/L (ref 135–145)
TOTAL PROTEIN: 7.5 g/dL (ref 6.0–8.3)

## 2017-03-20 LAB — LIPID PANEL
Cholesterol: 193 mg/dL (ref 0–200)
HDL: 83.3 mg/dL (ref >39.00)
LDL CALC: 82 mg/dL (ref 0–99)
NONHDL: 110.14
Total CHOL/HDL Ratio: 2
Triglycerides: 141 mg/dL (ref 0.0–149.0)
VLDL: 28.2 mg/dL (ref 0.0–40.0)

## 2017-03-20 LAB — TSH: TSH: 1.8 u[IU]/mL (ref 0.35–4.50)

## 2017-03-20 LAB — T4, FREE: Free T4: 0.7 ng/dL (ref 0.60–1.60)

## 2017-03-24 LAB — PAIN MGMT, PROFILE 8 W/CONF, U
6 Acetylmorphine: NEGATIVE ng/mL (ref <10)
ALCOHOL METABOLITES: POSITIVE ng/mL — AB (ref <500)
ALPHAHYDROXYALPRAZOLAM: NEGATIVE ng/mL (ref <25)
ALPHAHYDROXYMIDAZOLAM: NEGATIVE ng/mL (ref <50)
ALPHAHYDROXYTRIAZOLAM: NEGATIVE ng/mL (ref <50)
AMPHETAMINES: POSITIVE ng/mL — AB (ref <500)
Aminoclonazepam: NEGATIVE ng/mL (ref <25)
Amphetamine: 12412 ng/mL — ABNORMAL HIGH (ref <250)
BENZODIAZEPINES: NEGATIVE ng/mL (ref <100)
Buprenorphine, Urine: NEGATIVE ng/mL (ref <5)
COCAINE METABOLITE: NEGATIVE ng/mL (ref <150)
Creatinine: 159.7 mg/dL
ETHYL GLUCURONIDE (ETG): 17514 ng/mL — AB (ref <500)
Ethyl Sulfate (ETS): 1533 ng/mL — ABNORMAL HIGH (ref <100)
Hydroxyethylflurazepam: NEGATIVE ng/mL (ref <50)
LORAZEPAM: NEGATIVE ng/mL (ref <50)
MARIJUANA METABOLITE: NEGATIVE ng/mL (ref <20)
MDMA: NEGATIVE ng/mL (ref <500)
METHAMPHETAMINE: NEGATIVE ng/mL (ref <250)
NORDIAZEPAM: NEGATIVE ng/mL (ref <50)
OPIATES: NEGATIVE ng/mL (ref <100)
Oxazepam: NEGATIVE ng/mL (ref <50)
Oxidant: NEGATIVE ug/mL (ref <200)
Oxycodone: NEGATIVE ng/mL (ref <100)
Temazepam: NEGATIVE ng/mL (ref <50)
pH: 6.91 (ref 4.5–9.0)

## 2017-06-15 ENCOUNTER — Other Ambulatory Visit: Payer: Self-pay

## 2017-06-15 DIAGNOSIS — F988 Other specified behavioral and emotional disorders with onset usually occurring in childhood and adolescence: Secondary | ICD-10-CM

## 2017-06-15 NOTE — Telephone Encounter (Signed)
Requesting:vyvanse  Contract:yes UDS:low risk next screen 10/06/17 Last OV:03/19/17 Next OV:06/26/17 Last Refill:03/19/17 Database:   Please advise

## 2017-06-17 MED ORDER — METOPROLOL SUCCINATE ER 25 MG PO TB24: 12.5000 mg | ORAL_TABLET | Freq: Every day | ORAL | 3 refills | Status: DC

## 2017-06-17 MED ORDER — LISDEXAMFETAMINE DIMESYLATE 50 MG PO CAPS: 50.0000 mg | ORAL_CAPSULE | Freq: Every day | ORAL | 0 refills | Status: DC

## 2017-06-19 ENCOUNTER — Ambulatory Visit: Payer: Managed Care, Other (non HMO) | Admitting: Family Medicine

## 2017-06-26 ENCOUNTER — Ambulatory Visit: Payer: Managed Care, Other (non HMO) | Admitting: Family Medicine

## 2017-06-26 ENCOUNTER — Encounter: Payer: Self-pay | Admitting: Family Medicine

## 2017-06-26 DIAGNOSIS — R000 Tachycardia, unspecified: Secondary | ICD-10-CM

## 2017-06-26 DIAGNOSIS — F908 Attention-deficit hyperactivity disorder, other type: Secondary | ICD-10-CM | POA: Diagnosis not present

## 2017-06-26 DIAGNOSIS — E785 Hyperlipidemia, unspecified: Secondary | ICD-10-CM | POA: Diagnosis not present

## 2017-06-26 MED ORDER — METOPROLOL SUCCINATE ER 25 MG PO TB24: 25.0000 mg | ORAL_TABLET | Freq: Every day | ORAL | 1 refills | Status: DC

## 2017-06-26 NOTE — Progress Notes (Signed)
Subjective:  I acted as a Education administrator for Dr. Charlett Blake. [place], [place]  Patient ID: Katelyn [name], female    DOB: 04-27-1975, 42 y.o.   MRN: [id number]  No chief complaint on file.   HPI  Patient is in today for a 3 month follow up and she feels better. She notes she has felt better with the initiation of the metoprolol. Her palpitations and anxiety improved. Denies CP/palp/SOB/HA/congestion/fevers/GI or GU c/o. Taking meds as prescribed. Is tolerating the Vyvanse.   Patient Care Team: [name], MD as PCP - General (Family Medicine)   Past Medical History:  Diagnosis Date  . Acute bronchitis 01/03/2015  . ADD (attention deficit disorder)   . Anemia   . Anemia, iron deficiency 07/14/2013  . Chicken pox as a child  . Elevated blood pressure reading 08/11/2016  . Fibroid   . GERD (gastroesophageal reflux disease)   . H/O umbilical hernia repair 71/06/2692  . Preventative health care 07/20/2013    Past Surgical History:  Procedure Laterality Date  . axillary cyst     left side  . MYOMECTOMY  2008  . WISDOM TOOTH EXTRACTION      Family History  Problem Relation Age of Onset  . Thyroid disease Mother   . Diabetes Maternal Grandfather        type 2  . Diabetes Paternal Grandmother        type 2  . Heart attack Paternal Grandfather     Social History   Socioeconomic History  . Marital status: Married    Spouse name: Not on file  . Number of children: Not on file  . Years of education: Not on file  . Highest education level: Not on file  Occupational History  . Not on file  Social Needs  . Financial resource strain: Not on file  . Food insecurity:    Worry: Not on file    Inability: Not on file  . Transportation needs:    Medical: Not on file    Non-medical: Not on file  Tobacco Use  . Smoking status: Never Smoker  . Smokeless tobacco: Never Used  Substance and Sexual Activity  . Alcohol use: No  . Drug use: No  . Sexual activity: Yes    Birth  control/protection: None    Comment: lives with husband and daughter, work at [employer]. No dietary restrictions, MD  Lifestyle  . Physical activity:    Days per week: Not on file    Minutes per session: Not on file  . Stress: Not on file  Relationships  . Social connections:    Talks on phone: Not on file    Gets together: Not on file    Attends religious service: Not on file    Active member of club or organization: Not on file    Attends meetings of clubs or organizations: Not on file    Relationship status: Not on file  . Intimate partner violence:    Fear of current or ex partner: Not on file    Emotionally abused: Not on file    Physically abused: Not on file    Forced sexual activity: Not on file  Other Topics Concern  . Not on file  Social History Narrative  . Not on file    Outpatient Medications Prior to Visit  Medication Sig Dispense Refill  . ibuprofen (ADVIL,MOTRIN) 600 MG tablet Take 1 tablet (600 mg total) by mouth every 6 (six) hours. 40 tablet 0  .  levonorgestrel (MIRENA) 20 MCG/24HR IUD 1 each by Intrauterine route once.    . lisdexamfetamine (VYVANSE) 50 MG capsule Take 1 capsule (50 mg total) by mouth daily. 30 capsule 0  . lisdexamfetamine (VYVANSE) 50 MG capsule Take 1 capsule (50 mg total) by mouth daily. 30 capsule 0  . lisdexamfetamine (VYVANSE) 50 MG capsule Take 1 capsule (50 mg total) by mouth daily. 30 capsule 0  . metoprolol succinate (TOPROL-XL) 25 MG 24 hr tablet Take 0.5-1 tablets (12.5-25 mg total) by mouth daily. 30 tablet 3  . etonogestrel-ethinyl estradiol (NUVARING) 0.12-0.015 MG/24HR vaginal ring Place 1 Device vaginally every 30 (thirty) days.     No facility-administered medications prior to visit.     No Known Allergies  Review of Systems  Constitutional: Negative for fever and malaise/fatigue.  HENT: Negative for congestion.   Eyes: Negative for blurred vision.  Respiratory: Negative for shortness of breath.     Cardiovascular: Negative for chest pain, palpitations and leg swelling.  Gastrointestinal: Negative for abdominal pain, blood in stool and nausea.  Genitourinary: Negative for dysuria and frequency.  Musculoskeletal: Negative for falls.  Skin: Negative for rash.  Neurological: Negative for dizziness, loss of consciousness and headaches.  Endo/Heme/Allergies: Negative for environmental allergies.  Psychiatric/Behavioral: Negative for depression. The patient is not nervous/anxious.        Objective:    Physical Exam  Constitutional: She is oriented to person, place, and time. She appears well-developed and well-nourished. No distress.  HENT:  Head: Normocephalic and atraumatic.  Nose: Nose normal.  Eyes: Right eye exhibits no discharge. Left eye exhibits no discharge.  Neck: Normal range of motion. Neck supple.  Cardiovascular: Normal rate and regular rhythm.  No murmur heard. Pulmonary/Chest: Effort normal and breath sounds normal.  Abdominal: Soft. Bowel sounds are normal. There is no tenderness.  Musculoskeletal: She exhibits no edema.  Neurological: She is alert and oriented to person, place, and time.  Skin: Skin is warm and dry.  Psychiatric: She has a normal mood and affect.  Nursing note and vitals reviewed.   BP 118/76 (BP Location: Left Arm, Patient Position: Sitting, Cuff Size: Normal)   Pulse 81   Temp 97.6 F (36.4 C) (Oral)   Resp 18   Wt 149 lb 9.6 oz (67.9 kg)   SpO2 99%   BMI 26.50 kg/m  Wt Readings from Last 3 Encounters:  06/26/17 149 lb 9.6 oz (67.9 kg)  03/19/17 151 lb (68.5 kg)  11/23/16 147 lb 12.8 oz (67 kg)   BP Readings from Last 3 Encounters:  06/26/17 118/76  03/19/17 (!) 148/90  11/23/16 (!) 124/92     Immunization History  Administered Date(s) Administered  . Influenza-Unspecified 10/16/2012, 09/17/2014  . Tdap 06/26/2013    Health Maintenance  Topic Date Due  . PAP SMEAR  09/17/2015  . INFLUENZA VACCINE  08/16/2017  .  TETANUS/TDAP  06/27/2023  . HIV Screening  Completed    Lab Results  Component Value Date   WBC 7.0 03/19/2017   HGB 11.9 (L) 03/19/2017   HCT 37.2 03/19/2017   PLT 354.0 03/19/2017   GLUCOSE 88 03/19/2017   CHOL 193 03/19/2017   TRIG 141.0 03/19/2017   HDL 83.30 03/19/2017   LDLCALC 82 03/19/2017   ALT 12 03/19/2017   AST 14 03/19/2017   NA 138 03/19/2017   K 4.0 03/19/2017   CL 105 03/19/2017   CREATININE 0.86 03/19/2017   BUN 14 03/19/2017   CO2 26 03/19/2017   TSH 1.80  03/19/2017    Lab Results  Component Value Date   TSH 1.80 03/19/2017   Lab Results  Component Value Date   WBC 7.0 03/19/2017   HGB 11.9 (L) 03/19/2017   HCT 37.2 03/19/2017   MCV 72.5 (L) 03/19/2017   PLT 354.0 03/19/2017   Lab Results  Component Value Date   NA 138 03/19/2017   K 4.0 03/19/2017   CO2 26 03/19/2017   GLUCOSE 88 03/19/2017   BUN 14 03/19/2017   CREATININE 0.86 03/19/2017   BILITOT 0.2 03/19/2017   ALKPHOS 45 03/19/2017   AST 14 03/19/2017   ALT 12 03/19/2017   PROT 7.5 03/19/2017   ALBUMIN 3.9 03/19/2017   CALCIUM 9.4 03/19/2017   GFR 77.36 03/19/2017   Lab Results  Component Value Date   CHOL 193 03/19/2017   Lab Results  Component Value Date   HDL 83.30 03/19/2017   Lab Results  Component Value Date   LDLCALC 82 03/19/2017   Lab Results  Component Value Date   TRIG 141.0 03/19/2017   Lab Results  Component Value Date   CHOLHDL 2 03/19/2017   No results found for: HGBA1C       Assessment & Plan:   Problem List Items Addressed This Visit    ADD (attention deficit disorder)    Doing well on Vyvanse alllowed refills.       Hyperlipidemia    Encouraged heart healthy diet, increase exercise, avoid trans fats, consider a krill oil cap daily      Relevant Medications   metoprolol succinate (TOPROL-XL) 25 MG 24 hr tablet   Tachycardia    She noted a asignificant improvement with Metoprolol initiation. Given refill today         I have  discontinued Damaris Hippo etonogestrel-ethinyl estradiol. I have also changed her metoprolol succinate. Additionally, I am having her maintain her ibuprofen, lisdexamfetamine, lisdexamfetamine, lisdexamfetamine, and levonorgestrel.  Meds ordered this encounter  Medications  . metoprolol succinate (TOPROL-XL) 25 MG 24 hr tablet    Sig: Take 1 tablet (25 mg total) by mouth daily.    Dispense:  90 tablet    Refill:  1    CMA served as Education administrator during this visit. History, Physical and Plan performed by medical provider. Documentation and orders reviewed and attested to.  Penni Homans, MD

## 2017-06-26 NOTE — Patient Instructions (Signed)
Sinus Tachycardia °Sinus tachycardia is a kind of fast heartbeat. In sinus tachycardia, the heart beats more than 100 times a minute. Sinus tachycardia starts in a part of the heart called the sinus node. Sinus tachycardia may be harmless, or it may be a sign of a serious condition. °What are the causes? °This condition may be caused by: °· Exercise or exertion. °· A fever. °· Pain. °· Loss of body fluids (dehydration). °· Severe bleeding (hemorrhage). °· Anxiety and stress. °· Certain substances, including: °? Alcohol. °? Caffeine. °? Tobacco and nicotine products. °? Diet pills. °? Illegal drugs. °· Medical conditions including: °? Heart disease. °? An infection. °? An overactive thyroid (hyperthyroidism). °? A lack of red blood cells (anemia). ° °What are the signs or symptoms? °Symptoms of this condition include: °· A feeling that the heart is beating quickly (palpitations). °· Suddenly noticing your heartbeat (cardiac awareness). °· Dizziness. °· Tiredness (fatigue). °· Shortness of breath. °· Chest pain. °· Nausea. °· Fainting. ° °How is this diagnosed? °This condition is diagnosed with: °· A physical exam. °· Other tests, such as: °? Blood tests. °? An electrocardiogram (ECG). This test measures the electrical activity of the heart. °? Holter monitoring. For this test, you wear a device that records your heartbeat for one or more days. ° °You may be referred to a heart specialist (cardiologist). °How is this treated? °Treatment for this condition depends on the cause or underlying condition. Treatment may involve: °· Treating the underlying condition. °· Taking new medicines or changing your current medicines as told by your health care provider. °· Making changes to your diet or lifestyle. °· Practicing relaxation methods. ° °Follow these instructions at home: °Lifestyle °· Do not use any products that contain nicotine or tobacco, such as cigarettes and e-cigarettes. If you need help quitting, ask your  health care provider. °· Learn relaxation methods, like deep breathing, to help you when you get stressed or anxious. °· Do not use illegal drugs, such as cocaine. °· Do not abuse alcohol. Limit alcohol intake to no more than 1 drink a day for non-pregnant women and 2 drinks a day for men. One drink equals 12 oz of beer, 5 oz of wine, or 1½ oz of hard liquor. °· Find time to rest and relax often. This reduces stress. °· Avoid: °? Caffeine. °? Stimulants such as over-the-counter diet pills or pills that help you to stay awake. °? Situations that cause anxiety or stress. °General instructions °· Drink enough fluids to keep your urine clear or pale yellow. °· Take over-the-counter and prescription medicines only as told by your health care provider. °· Keep all follow-up visits as told by your health care provider. This is important. °Contact a health care provider if: °· You have a fever. °· You have vomiting or diarrhea that keeps happening (is persistent). °Get help right away if: °· You have pain in your chest, upper arms, jaw, or neck. °· You become weak or dizzy. °· You feel faint. °· You have palpitations that do not go away. °This information is not intended to replace advice given to you by your health care provider. Make sure you discuss any questions you have with your health care provider. °Document Released: 02/10/2004 Document Revised: 07/31/2015 Document Reviewed: 07/17/2014 °Elsevier Interactive Patient Education © 2018 [employer] ° °

## 2017-06-27 NOTE — Assessment & Plan Note (Signed)
She noted a asignificant improvement with Metoprolol initiation. Given refill today

## 2017-06-27 NOTE — Assessment & Plan Note (Signed)
Doing well on Vyvanse alllowed refills.

## 2017-06-27 NOTE — Assessment & Plan Note (Signed)
Encouraged heart healthy diet, increase exercise, avoid trans fats, consider a krill oil cap daily 

## 2017-09-12 ENCOUNTER — Other Ambulatory Visit: Payer: Self-pay | Admitting: Family Medicine

## 2017-09-12 DIAGNOSIS — F988 Other specified behavioral and emotional disorders with onset usually occurring in childhood and adolescence: Secondary | ICD-10-CM

## 2017-09-14 NOTE — Telephone Encounter (Signed)
Pt calling about the RX Vyvanse please give her a call at [phone]

## 2017-09-14 NOTE — Telephone Encounter (Signed)
Refill request on lisdexamfetamine (VYVANSE) 50 MG capsule.   Last OV: 06/26/17 Last RF: 06/17/17  CSC: completed and signed 03/19/17  UDS: sample given 04/05/17 Low risk next screen 10/06/17

## 2017-10-15 ENCOUNTER — Other Ambulatory Visit: Payer: Self-pay | Admitting: Family Medicine

## 2017-10-15 DIAGNOSIS — F988 Other specified behavioral and emotional disorders with onset usually occurring in childhood and adolescence: Secondary | ICD-10-CM

## 2017-10-16 MED ORDER — LISDEXAMFETAMINE DIMESYLATE 50 MG PO CAPS: 50.0000 mg | ORAL_CAPSULE | Freq: Every day | ORAL | 0 refills | Status: DC

## 2017-10-16 NOTE — Telephone Encounter (Signed)
Requesting:vyvanse Contract:yes UDS:low risk next screen 09/25/17 Last OV:06/26/17 Next OV:12/25/17 Last Refill: 06/17/17 #[id number] July, aug, sept Database:   Please advise

## 2017-10-30 ENCOUNTER — Telehealth: Payer: Self-pay

## 2017-10-30 NOTE — Telephone Encounter (Signed)
Author reviewing [facility] calls, and noted fax sent regarding pt's reported bacterial eye conjunctivitis. Pt was prescribed vigamox. Author phoned pt. To check on status. Pt. Stated her symptoms have cleared up, and is appreciative of follow-up call.

## 2017-11-05 ENCOUNTER — Other Ambulatory Visit: Payer: Self-pay | Admitting: Family Medicine

## 2017-11-05 DIAGNOSIS — F988 Other specified behavioral and emotional disorders with onset usually occurring in childhood and adolescence: Secondary | ICD-10-CM

## 2017-11-05 NOTE — Telephone Encounter (Signed)
Copied from [place] [id number]. Topic: Quick Communication - See Telephone Encounter >> Nov 05, 2017  1:09 PM Ivar Drape wrote: CRM for notification. See Telephone encounter for: 11/05/17. Patient would like the prescriptions for her Nov and Dec lisdexamfetamine (VYVANSE) 50 MG capsule medication to be sent to her preferred pharmacy [facility] in [place].  The [facility] [place] is closing on 11/16/17. Patient has already picked up the October refill.

## 2017-11-06 MED ORDER — LISDEXAMFETAMINE DIMESYLATE 50 MG PO CAPS
50.0000 mg | ORAL_CAPSULE | Freq: Every day | ORAL | 0 refills | Status: DC
Start: 2017-11-06 — End: 2017-11-08

## 2017-11-06 MED ORDER — LISDEXAMFETAMINE DIMESYLATE 50 MG PO CAPS: 50.0000 mg | ORAL_CAPSULE | Freq: Every day | ORAL | 0 refills | Status: DC

## 2017-11-06 NOTE — Telephone Encounter (Signed)
Medication sent in via fax

## 2017-11-08 MED ORDER — LISDEXAMFETAMINE DIMESYLATE 50 MG PO CAPS: 50.0000 mg | ORAL_CAPSULE | Freq: Every day | ORAL | 0 refills | Status: DC

## 2017-11-08 NOTE — Addendum Note (Signed)
Addended by: Magdalene Molly A on: 11/08/2017 12:08 PM   Modules accepted: Orders

## 2017-11-08 NOTE — Telephone Encounter (Signed)
Patient needs medication sent to new pharmacy   Please send

## 2017-11-12 ENCOUNTER — Other Ambulatory Visit: Payer: Self-pay | Admitting: Family Medicine

## 2017-11-15 ENCOUNTER — Other Ambulatory Visit: Payer: Self-pay | Admitting: Family Medicine

## 2017-12-25 ENCOUNTER — Ambulatory Visit: Payer: Managed Care, Other (non HMO) | Admitting: Family Medicine

## 2017-12-25 ENCOUNTER — Encounter: Payer: Self-pay | Admitting: Family Medicine

## 2017-12-25 DIAGNOSIS — Z0000 Encounter for general adult medical examination without abnormal findings: Secondary | ICD-10-CM | POA: Diagnosis not present

## 2017-12-25 DIAGNOSIS — R000 Tachycardia, unspecified: Secondary | ICD-10-CM | POA: Diagnosis not present

## 2017-12-25 DIAGNOSIS — E785 Hyperlipidemia, unspecified: Secondary | ICD-10-CM

## 2017-12-25 DIAGNOSIS — F988 Other specified behavioral and emotional disorders with onset usually occurring in childhood and adolescence: Secondary | ICD-10-CM | POA: Diagnosis not present

## 2017-12-25 LAB — LIPID PANEL
Cholesterol: 182 mg/dL (ref 0–200)
HDL: 66.5 mg/dL (ref >39.00)
LDL Cholesterol: 98 mg/dL (ref 0–99)
NonHDL: 115.29
TRIGLYCERIDES: 84 mg/dL (ref 0.0–149.0)
Total CHOL/HDL Ratio: 3
VLDL: 16.8 mg/dL (ref 0.0–40.0)

## 2017-12-25 LAB — TSH: TSH: 2.13 u[IU]/mL (ref 0.35–4.50)

## 2017-12-25 LAB — COMPREHENSIVE METABOLIC PANEL
ALK PHOS: 46 U/L (ref 39–117)
ALT: 20 U/L (ref 0–35)
AST: 18 U/L (ref 0–37)
Albumin: 4.4 g/dL (ref 3.5–5.2)
BUN: 11 mg/dL (ref 6–23)
CO2: 26 mEq/L (ref 19–32)
Calcium: 9.3 mg/dL (ref 8.4–10.5)
Chloride: 105 mEq/L (ref 96–112)
Creatinine, Ser: 0.82 mg/dL (ref 0.40–1.20)
GFR: 81.42 mL/min (ref >60.00)
GLUCOSE: 99 mg/dL (ref 70–99)
POTASSIUM: 4.3 meq/L (ref 3.5–5.1)
SODIUM: 137 meq/L (ref 135–145)
TOTAL PROTEIN: 7.2 g/dL (ref 6.0–8.3)
Total Bilirubin: 0.4 mg/dL (ref 0.2–1.2)

## 2017-12-25 LAB — CBC
HEMATOCRIT: 43.2 % (ref 36.0–46.0)
HEMOGLOBIN: 14.1 g/dL (ref 12.0–15.0)
MCHC: 32.6 g/dL (ref 30.0–36.0)
MCV: 79.8 fl (ref 78.0–100.0)
Platelets: 360 10*3/uL (ref 150.0–400.0)
RBC: 5.41 Mil/uL — ABNORMAL HIGH (ref 3.87–5.11)
RDW: 15.2 % (ref 11.5–15.5)
WBC: 7.8 10*3/uL (ref 4.0–10.5)

## 2017-12-25 MED ORDER — METOPROLOL SUCCINATE ER 25 MG PO TB24: 25.0000 mg | ORAL_TABLET | Freq: Every day | ORAL | 1 refills | Status: DC

## 2017-12-25 MED ORDER — LISDEXAMFETAMINE DIMESYLATE 50 MG PO CAPS: 50.0000 mg | ORAL_CAPSULE | Freq: Every day | ORAL | 0 refills | Status: DC

## 2017-12-25 NOTE — Assessment & Plan Note (Signed)
Encouraged heart healthy diet, increase exercise, avoid trans fats, consider a krill oil cap daily 

## 2017-12-25 NOTE — Patient Instructions (Signed)
Preventive Care 18-39 Years, Female Preventive care refers to lifestyle choices and visits with your health care provider that can promote health and wellness. What does preventive care include?  A yearly physical exam. This is also called an annual well check.  Dental exams once or twice a year.  Routine eye exams. Ask your health care provider how often you should have your eyes checked.  Personal lifestyle choices, including: ? Daily care of your teeth and gums. ? Regular physical activity. ? Eating a healthy diet. ? Avoiding tobacco and drug use. ? Limiting alcohol use. ? Practicing safe sex. ? Taking vitamin and mineral supplements as recommended by your health care provider. What happens during an annual well check? The services and screenings done by your health care provider during your annual well check will depend on your age, overall health, lifestyle risk factors, and family history of disease. Counseling Your health care provider may ask you questions about your:  Alcohol use.  Tobacco use.  Drug use.  Emotional well-being.  Home and relationship well-being.  Sexual activity.  Eating habits.  Work and work Statistician.  Method of birth control.  Menstrual cycle.  Pregnancy history.  Screening You may have the following tests or measurements:  Height, weight, and BMI.  Diabetes screening. This is done by checking your blood sugar (glucose) after you have not eaten for a while (fasting).  Blood pressure.  Lipid and cholesterol levels. These may be checked every 5 years starting at age 66.  Skin check.  Hepatitis C blood test.  Hepatitis B blood test.  Sexually transmitted disease (STD) testing.  BRCA-related cancer screening. This may be done if you have a family history of breast, ovarian, tubal, or peritoneal cancers.  Pelvic exam and Pap test. This may be done every 3 years starting at age 40. Starting at age 59, this may be done every 5  years if you have a Pap test in combination with an HPV test.  Discuss your test results, treatment options, and if necessary, the need for more tests with your health care provider. Vaccines Your health care provider may recommend certain vaccines, such as:  Influenza vaccine. This is recommended every year.  Tetanus, diphtheria, and acellular pertussis (Tdap, Td) vaccine. You may need a Td booster every 10 years.  Varicella vaccine. You may need this if you have not been vaccinated.  HPV vaccine. If you are 69 or younger, you may need three doses over 6 months.  Measles, mumps, and rubella (MMR) vaccine. You may need at least one dose of MMR. You may also need a second dose.  Pneumococcal 13-valent conjugate (PCV13) vaccine. You may need this if you have certain conditions and were not previously vaccinated.  Pneumococcal polysaccharide (PPSV23) vaccine. You may need one or two doses if you smoke cigarettes or if you have certain conditions.  Meningococcal vaccine. One dose is recommended if you are age 27-21 years and a first-year college student living in a residence hall, or if you have one of several medical conditions. You may also need additional booster doses.  Hepatitis A vaccine. You may need this if you have certain conditions or if you travel or work in places where you may be exposed to hepatitis A.  Hepatitis B vaccine. You may need this if you have certain conditions or if you travel or work in places where you may be exposed to hepatitis B.  Haemophilus influenzae type b (Hib) vaccine. You may need this if  you have certain risk factors.  Talk to your health care provider about which screenings and vaccines you need and how often you need them. This information is not intended to replace advice given to you by your health care provider. Make sure you discuss any questions you have with your health care provider. Document Released: 02/28/2001 Document Revised: 09/22/2015  Document Reviewed: 11/03/2014 Elsevier Interactive Patient Education  [employer].

## 2017-12-25 NOTE — Assessment & Plan Note (Addendum)
Patient encouraged to maintain heart healthy diet, regular exercise, adequate sleep. Consider daily probiotics. Take medications as prescribed. Labs ordered and reviewed,

## 2017-12-29 NOTE — Assessment & Plan Note (Signed)
Doing well at home and at work on [place] without concerning side effects. Allowed refills and UDS and contract utd

## 2017-12-29 NOTE — Assessment & Plan Note (Signed)
Doing well on Metoprolol. 

## 2017-12-29 NOTE — Progress Notes (Signed)
Subjective:    Patient ID: Katelyn [name], female    DOB: [birth year], 42 y.o.   MRN: [id number]  No chief complaint on file.   HPI Patient is in today for annual preventative exam.  She feels well today.  No recent febrile illness or hospitalization.  She continues to take Vyvanse with good results.  Helps her to focus at work and at home.  She denies any concerning side effects.  She has had no recent hospitalization or acute febrile illness.  She is managing her activities of daily living well.  She continues to stay active and tries to maintain a heart healthy diet. Denies CP/palp/SOB/HA/congestion/fevers/GI or GU c/o. Taking meds as prescribed  Past Medical History:  Diagnosis Date  . Acute bronchitis 01/03/2015  . ADD (attention deficit disorder)   . Anemia   . Anemia, iron deficiency 07/14/2013  . Chicken pox as a child  . Elevated blood pressure reading 08/11/2016  . Fibroid   . GERD (gastroesophageal reflux disease)   . H/O umbilical hernia repair 59/56/3875  . Preventative health care 07/20/2013    Past Surgical History:  Procedure Laterality Date  . axillary cyst     left side  . MYOMECTOMY  2008  . WISDOM TOOTH EXTRACTION      Family History  Problem Relation Age of Onset  . Thyroid disease Mother   . Diabetes Maternal Grandfather        type 2  . Diabetes Paternal Grandmother        type 2  . Heart attack Paternal Grandfather     Social History   Socioeconomic History  . Marital status: Married    Spouse name: Not on file  . Number of children: Not on file  . Years of education: Not on file  . Highest education level: Not on file  Occupational History  . Not on file  Social Needs  . Financial resource strain: Not on file  . Food insecurity:    Worry: Not on file    Inability: Not on file  . Transportation needs:    Medical: Not on file    Non-medical: Not on file  Tobacco Use  . Smoking status: Never Smoker  . Smokeless tobacco: Never Used    Substance and Sexual Activity  . Alcohol use: No  . Drug use: No  . Sexual activity: Yes    Birth control/protection: None    Comment: lives with husband and daughter, work at [employer]. No dietary restrictions, MD  Lifestyle  . Physical activity:    Days per week: Not on file    Minutes per session: Not on file  . Stress: Not on file  Relationships  . Social connections:    Talks on phone: Not on file    Gets together: Not on file    Attends religious service: Not on file    Active member of club or organization: Not on file    Attends meetings of clubs or organizations: Not on file    Relationship status: Not on file  . Intimate partner violence:    Fear of current or ex partner: Not on file    Emotionally abused: Not on file    Physically abused: Not on file    Forced sexual activity: Not on file  Other Topics Concern  . Not on file  Social History Narrative  . Not on file    Outpatient Medications Prior to Visit  Medication Sig Dispense Refill  .  levonorgestrel (MIRENA) 20 MCG/24HR IUD 1 each by Intrauterine route once.    Marland Kitchen ibuprofen (ADVIL,MOTRIN) 600 MG tablet Take 1 tablet (600 mg total) by mouth every 6 (six) hours. 40 tablet 0  . lisdexamfetamine (VYVANSE) 50 MG capsule Take 1 capsule (50 mg total) by mouth daily. October 2019 30 capsule 0  . lisdexamfetamine (VYVANSE) 50 MG capsule Take 1 capsule (50 mg total) by mouth daily. December 2019 30 capsule 0  . lisdexamfetamine (VYVANSE) 50 MG capsule Take 1 capsule (50 mg total) by mouth daily. November 2019 30 capsule 0  . metoprolol succinate (TOPROL-XL) 25 MG 24 hr tablet Take 1 tablet (25 mg total) by mouth daily. 90 tablet 1  . metoprolol succinate (TOPROL-XL) 25 MG 24 hr tablet Take 0.5-1 tablets (12.5-25 mg total) by mouth daily. 30 tablet 3  . metoprolol succinate (TOPROL-XL) 25 MG 24 hr tablet TAKE 1/2 TO 1 TABLET BY MOUTH DAILY 30 tablet 0  . metoprolol succinate (TOPROL-XL) 25 MG 24 hr tablet TAKE  0.5-1 TABLETS(12.5-25 MG TOTAL) BY MOUTH DAILY. 30 tablet 0   No facility-administered medications prior to visit.     No Known Allergies  Review of Systems  Constitutional: Negative for chills, fever and malaise/fatigue.  HENT: Negative for congestion and hearing loss.   Eyes: Negative for discharge.  Respiratory: Negative for cough, sputum production and shortness of breath.   Cardiovascular: Negative for chest pain, palpitations and leg swelling.  Gastrointestinal: Negative for abdominal pain, blood in stool, constipation, diarrhea, heartburn, nausea and vomiting.  Genitourinary: Negative for dysuria, frequency, hematuria and urgency.  Musculoskeletal: Negative for back pain, falls and myalgias.  Skin: Negative for rash.  Neurological: Negative for dizziness, sensory change, loss of consciousness, weakness and headaches.  Endo/Heme/Allergies: Negative for environmental allergies. Does not bruise/bleed easily.  Psychiatric/Behavioral: Negative for depression and suicidal ideas. The patient is not nervous/anxious and does not have insomnia.        Objective:    Physical Exam Vitals signs and nursing note reviewed.  Constitutional:      General: She is not in acute distress.    Appearance: She is well-developed.  HENT:     Head: Normocephalic and atraumatic.     Right Ear: Tympanic membrane and ear canal normal. There is impacted cerumen.     Left Ear: Tympanic membrane and ear canal normal.     Nose: Nose normal. No congestion.     Mouth/Throat:     Mouth: Mucous membranes are moist.     Pharynx: Oropharynx is clear.  Eyes:     General:        Right eye: No discharge.        Left eye: No discharge.     Extraocular Movements: Extraocular movements intact.     Conjunctiva/sclera: Conjunctivae normal.     Pupils: Pupils are equal, round, and reactive to light.  Neck:     Musculoskeletal: Normal range of motion and neck supple.  Cardiovascular:     Rate and Rhythm: Normal  rate and regular rhythm.     Pulses: Normal pulses.     Heart sounds: Normal heart sounds. No murmur.  Pulmonary:     Effort: Pulmonary effort is normal. No respiratory distress.     Breath sounds: Normal breath sounds. No stridor.  Abdominal:     General: Bowel sounds are normal. There is no distension.     Palpations: Abdomen is soft. There is no mass.     Tenderness: There is  no abdominal tenderness.  Skin:    General: Skin is warm and dry.  Neurological:     Mental Status: She is alert and oriented to person, place, and time.     BP 120/86 (BP Location: Left Arm, Patient Position: Sitting, Cuff Size: Normal)   Pulse 92   Temp 98 F (36.7 C) (Oral)   Resp 18   Wt 152 lb 3.2 oz (69 kg)   SpO2 99%   BMI 26.96 kg/m  Wt Readings from Last 3 Encounters:  12/25/17 152 lb 3.2 oz (69 kg)  06/26/17 149 lb 9.6 oz (67.9 kg)  03/19/17 151 lb (68.5 kg)     Lab Results  Component Value Date   WBC 7.8 12/25/2017   HGB 14.1 12/25/2017   HCT 43.2 12/25/2017   PLT 360.0 12/25/2017   GLUCOSE 99 12/25/2017   CHOL 182 12/25/2017   TRIG 84.0 12/25/2017   HDL 66.50 12/25/2017   LDLCALC 98 12/25/2017   ALT 20 12/25/2017   AST 18 12/25/2017   NA 137 12/25/2017   K 4.3 12/25/2017   CL 105 12/25/2017   CREATININE 0.82 12/25/2017   BUN 11 12/25/2017   CO2 26 12/25/2017   TSH 2.13 12/25/2017    Lab Results  Component Value Date   TSH 2.13 12/25/2017   Lab Results  Component Value Date   WBC 7.8 12/25/2017   HGB 14.1 12/25/2017   HCT 43.2 12/25/2017   MCV 79.8 12/25/2017   PLT 360.0 12/25/2017   Lab Results  Component Value Date   NA 137 12/25/2017   K 4.3 12/25/2017   CO2 26 12/25/2017   GLUCOSE 99 12/25/2017   BUN 11 12/25/2017   CREATININE 0.82 12/25/2017   BILITOT 0.4 12/25/2017   ALKPHOS 46 12/25/2017   AST 18 12/25/2017   ALT 20 12/25/2017   PROT 7.2 12/25/2017   ALBUMIN 4.4 12/25/2017   CALCIUM 9.3 12/25/2017   GFR 81.42 12/25/2017   Lab Results   Component Value Date   CHOL 182 12/25/2017   Lab Results  Component Value Date   HDL 66.50 12/25/2017   Lab Results  Component Value Date   LDLCALC 98 12/25/2017   Lab Results  Component Value Date   TRIG 84.0 12/25/2017   Lab Results  Component Value Date   CHOLHDL 3 12/25/2017   No results found for: HGBA1C     Assessment & Plan:   Problem List Items Addressed This Visit    Preventative health care    Patient encouraged to maintain heart healthy diet, regular exercise, adequate sleep. Consider daily probiotics. Take medications as prescribed. Labs ordered and reviewed,       Relevant Orders   CBC (Completed)   Comprehensive metabolic panel (Completed)   TSH (Completed)   ADD (attention deficit disorder)    Doing well at home and at work on Vyvanse without concerning side effects. Allowed refills and UDS and contract utd      Relevant Medications   lisdexamfetamine (VYVANSE) 50 MG capsule   lisdexamfetamine (VYVANSE) 50 MG capsule   lisdexamfetamine (VYVANSE) 50 MG capsule   Hyperlipidemia    Encouraged heart healthy diet, increase exercise, avoid trans fats, consider a krill oil cap daily      Relevant Medications   metoprolol succinate (TOPROL-XL) 25 MG 24 hr tablet   Other Relevant Orders   Lipid panel (Completed)   Tachycardia    Doing well on Metoprolol  I have discontinued Katelyn [name]'s ibuprofen. I have also changed her lisdexamfetamine, lisdexamfetamine, and lisdexamfetamine. Additionally, I am having her maintain her levonorgestrel and metoprolol succinate.  Meds ordered this encounter  Medications  . lisdexamfetamine (VYVANSE) 50 MG capsule    Sig: Take 1 capsule (50 mg total) by mouth daily. March 2020    Dispense:  30 capsule    Refill:  0  . lisdexamfetamine (VYVANSE) 50 MG capsule    Sig: Take 1 capsule (50 mg total) by mouth daily. February 2020    Dispense:  30 capsule    Refill:  0  . lisdexamfetamine (VYVANSE) 50 MG  capsule    Sig: Take 1 capsule (50 mg total) by mouth daily. January 2020    Dispense:  30 capsule    Refill:  0  . metoprolol succinate (TOPROL-XL) 25 MG 24 hr tablet    Sig: Take 1 tablet (25 mg total) by mouth daily.    Dispense:  90 tablet    Refill:  1     Penni Homans, MD

## 2018-04-03 ENCOUNTER — Telehealth: Payer: Self-pay | Admitting: Family Medicine

## 2018-04-03 NOTE — Telephone Encounter (Signed)
Copied from [place] [id number]. Topic: Quick Communication - Rx Refill/Question >> Apr 03, 2018 10:46 AM Bea Graff, NT wrote: Medication: lisdexamfetamine (VYVANSE) 50 MG capsule  Has the patient contacted their pharmacy? Yes.   (Agent: If no, request that the patient contact the pharmacy for the refill.) (Agent: If yes, when and what did the pharmacy advise?)  Preferred Pharmacy (with phone number or street name): [id number] - [place], [address] [place] [phone] (Phone) [phone] (Fax)    Agent: Please be advised that RX refills may take up to 3 business days. We ask that you follow-up with your pharmacy.

## 2018-04-03 NOTE — Telephone Encounter (Signed)
March Rx already at pharmacy. Just needs to contact pharmacy directly.

## 2018-04-08 ENCOUNTER — Other Ambulatory Visit: Payer: Self-pay | Admitting: Family Medicine

## 2018-04-08 NOTE — Telephone Encounter (Signed)
Pt got 3 scripts 12/25/17. Dec, Jan , Feb only. Pt states she needs march.  Pharmacy confirmed she has no more refills lisdexamfetamine (VYVANSE) 50 MG capsule  [id number] - [place], [place] - [address] [phone] (Phone) [phone] (Fax)

## 2018-04-08 NOTE — Telephone Encounter (Signed)
Yes we sent in Jan, Feb and March together please let her know

## 2018-04-09 NOTE — Telephone Encounter (Signed)
Pt called back in ask about RX.  I called the pharmacy, and confirmed pt's Rx filled 12/24, 1/30, and 2/27.  Pt does not have a Rx for March.  Pharmacy confirmed no more scripts for vyvanse and advised pt I would ask again.

## 2018-04-10 ENCOUNTER — Other Ambulatory Visit: Payer: Self-pay | Admitting: Family Medicine

## 2018-04-10 DIAGNOSIS — F988 Other specified behavioral and emotional disorders with onset usually occurring in childhood and adolescence: Secondary | ICD-10-CM

## 2018-04-10 MED ORDER — LISDEXAMFETAMINE DIMESYLATE 50 MG PO CAPS: 50.0000 mg | ORAL_CAPSULE | Freq: Every day | ORAL | 0 refills | Status: DC

## 2018-04-10 NOTE — Telephone Encounter (Signed)
Please advise 

## 2018-04-10 NOTE — Telephone Encounter (Signed)
I went ahead and resent a March, and sent in an April and May please let her know

## 2018-04-10 NOTE — Telephone Encounter (Signed)
Patient calling to check the status of getting this medication sent to the pharmacy. Please advise.

## 2018-06-17 ENCOUNTER — Other Ambulatory Visit: Payer: Self-pay

## 2018-06-17 ENCOUNTER — Ambulatory Visit (INDEPENDENT_AMBULATORY_CARE_PROVIDER_SITE_OTHER): Payer: Managed Care, Other (non HMO) | Admitting: Family Medicine

## 2018-06-17 DIAGNOSIS — N3281 Overactive bladder: Secondary | ICD-10-CM

## 2018-06-17 DIAGNOSIS — F988 Other specified behavioral and emotional disorders with onset usually occurring in childhood and adolescence: Secondary | ICD-10-CM | POA: Diagnosis not present

## 2018-06-17 DIAGNOSIS — R030 Elevated blood-pressure reading, without diagnosis of hypertension: Secondary | ICD-10-CM

## 2018-06-17 DIAGNOSIS — E785 Hyperlipidemia, unspecified: Secondary | ICD-10-CM | POA: Diagnosis not present

## 2018-06-17 DIAGNOSIS — R000 Tachycardia, unspecified: Secondary | ICD-10-CM

## 2018-06-17 MED ORDER — METOPROLOL SUCCINATE ER 25 MG PO TB24: 25.0000 mg | ORAL_TABLET | Freq: Every day | ORAL | 1 refills | Status: DC

## 2018-06-17 MED ORDER — MIRABEGRON ER 25 MG PO TB24: 25.0000 mg | ORAL_TABLET | Freq: Every day | ORAL | 3 refills | Status: DC

## 2018-06-17 MED ORDER — LISDEXAMFETAMINE DIMESYLATE 50 MG PO CAPS: 50.0000 mg | ORAL_CAPSULE | Freq: Every day | ORAL | 0 refills | Status: DC

## 2018-06-17 NOTE — Assessment & Plan Note (Signed)
Refills given on Vyvanse.

## 2018-06-17 NOTE — Assessment & Plan Note (Signed)
Encouraged to get a weekly set of vitals and report any concerning numbers.

## 2018-06-17 NOTE — Assessment & Plan Note (Signed)
Encouraged heart healthy diet, increase exercise, avoid trans fats, consider a krill oil cap daily 

## 2018-06-17 NOTE — Progress Notes (Signed)
Virtual Visit via Video Note  I connected with Katelyn [name] on 06/17/18 at  2:00 PM EDT by a video enabled telemedicine application and verified that I am speaking with the correct person using two identifiers.  Location: Patient: home Provider: office   I discussed the limitations of evaluation and management by telemedicine and the availability of in person appointments. The patient expressed understanding and agreed to proceed. Katelyn [name] cma was able to get patient set up on virtual visit    Subjective:    Patient ID: Katelyn [name], female    DOB: May 22, 1975, 43 y.o.   MRN: [id number]  No chief complaint on file.   HPI Patient is in today for follow up on chronic medical concerns including ADD, tachycardia and more. She feels well today but notes a long history of overactive bladder symptoms of urgency and frequency. She used Oxybutynin patches initially and it helped but she developed a rash so she stopped it. Denies CP/palp/SOB/HA/congestion/fevers/GI or GU c/o. Taking meds as prescribed  Past Medical History:  Diagnosis Date  . Acute bronchitis 01/03/2015  . ADD (attention deficit disorder)   . Anemia   . Anemia, iron deficiency 07/14/2013  . Chicken pox as a child  . Elevated blood pressure reading 08/11/2016  . Fibroid   . GERD (gastroesophageal reflux disease)   . H/O umbilical hernia repair 64/33/2951  . Preventative health care 07/20/2013    Past Surgical History:  Procedure Laterality Date  . axillary cyst     left side  . MYOMECTOMY  2008  . WISDOM TOOTH EXTRACTION      Family History  Problem Relation Age of Onset  . Thyroid disease Mother   . Diabetes Maternal Grandfather        type 2  . Diabetes Paternal Grandmother        type 2  . Heart attack Paternal Grandfather     Social History   Socioeconomic History  . Marital status: Married    Spouse name: Not on file  . Number of children: Not on file  . Years of education: Not on file  .  Highest education level: Not on file  Occupational History  . Not on file  Social Needs  . Financial resource strain: Not on file  . Food insecurity:    Worry: Not on file    Inability: Not on file  . Transportation needs:    Medical: Not on file    Non-medical: Not on file  Tobacco Use  . Smoking status: Never Smoker  . Smokeless tobacco: Never Used  Substance and Sexual Activity  . Alcohol use: No  . Drug use: No  . Sexual activity: Yes    Birth control/protection: None    Comment: lives with husband and daughter, work at [employer]. No dietary restrictions, MD  Lifestyle  . Physical activity:    Days per week: Not on file    Minutes per session: Not on file  . Stress: Not on file  Relationships  . Social connections:    Talks on phone: Not on file    Gets together: Not on file    Attends religious service: Not on file    Active member of club or organization: Not on file    Attends meetings of clubs or organizations: Not on file    Relationship status: Not on file  . Intimate partner violence:    Fear of current or ex partner: Not on file    Emotionally  abused: Not on file    Physically abused: Not on file    Forced sexual activity: Not on file  Other Topics Concern  . Not on file  Social History Narrative  . Not on file    Outpatient Medications Prior to Visit  Medication Sig Dispense Refill  . levonorgestrel (MIRENA) 20 MCG/24HR IUD 1 each by Intrauterine route once.    . lisdexamfetamine (VYVANSE) 50 MG capsule Take 1 capsule (50 mg total) by mouth daily. March 2020 30 capsule 0  . lisdexamfetamine (VYVANSE) 50 MG capsule Take 1 capsule (50 mg total) by mouth daily. April 2020 30 capsule 0  . lisdexamfetamine (VYVANSE) 50 MG capsule Take 1 capsule (50 mg total) by mouth daily. May 2020 30 capsule 0  . metoprolol succinate (TOPROL-XL) 25 MG 24 hr tablet Take 1 tablet (25 mg total) by mouth daily. 90 tablet 1   No facility-administered medications prior to  visit.     Allergies  Allergen Reactions  . Adhesive [Tape] Rash    Oxybutynin patch caused blistering rash    Review of Systems  Constitutional: Negative for fever and malaise/fatigue.  HENT: Negative for congestion.   Eyes: Negative for blurred vision.  Respiratory: Negative for shortness of breath.   Cardiovascular: Negative for chest pain, palpitations and leg swelling.  Gastrointestinal: Negative for abdominal pain, blood in stool and nausea.  Genitourinary: Positive for frequency. Negative for dysuria.  Musculoskeletal: Negative for falls.  Skin: Negative for rash.  Neurological: Negative for dizziness, loss of consciousness and headaches.  Endo/Heme/Allergies: Negative for environmental allergies.  Psychiatric/Behavioral: Negative for depression. The patient is not nervous/anxious.        Objective:    Physical Exam Constitutional:      Appearance: Normal appearance. She is not ill-appearing.  HENT:     Head: Normocephalic and atraumatic.     Nose: Nose normal.  Pulmonary:     Effort: Pulmonary effort is normal.  Neurological:     Mental Status: She is alert and oriented to person, place, and time.  Psychiatric:        Mood and Affect: Mood normal.        Behavior: Behavior normal.     BP 128/76 (BP Location: Left Arm, Patient Position: Sitting, Cuff Size: Normal)   Pulse 72   Temp (!) 97.1 F (36.2 C) (Oral)   Resp 12   Wt 150 lb (68 kg)   SpO2 100%   BMI 26.57 kg/m  Wt Readings from Last 3 Encounters:  06/17/18 150 lb (68 kg)  12/25/17 152 lb 3.2 oz (69 kg)  06/26/17 149 lb 9.6 oz (67.9 kg)    Diabetic Foot Exam - Simple   No data filed     Lab Results  Component Value Date   WBC 7.8 12/25/2017   HGB 14.1 12/25/2017   HCT 43.2 12/25/2017   PLT 360.0 12/25/2017   GLUCOSE 99 12/25/2017   CHOL 182 12/25/2017   TRIG 84.0 12/25/2017   HDL 66.50 12/25/2017   LDLCALC 98 12/25/2017   ALT 20 12/25/2017   AST 18 12/25/2017   NA 137 12/25/2017    K 4.3 12/25/2017   CL 105 12/25/2017   CREATININE 0.82 12/25/2017   BUN 11 12/25/2017   CO2 26 12/25/2017   TSH 2.13 12/25/2017    Lab Results  Component Value Date   TSH 2.13 12/25/2017   Lab Results  Component Value Date   WBC 7.8 12/25/2017   HGB 14.1 12/25/2017  HCT 43.2 12/25/2017   MCV 79.8 12/25/2017   PLT 360.0 12/25/2017   Lab Results  Component Value Date   NA 137 12/25/2017   K 4.3 12/25/2017   CO2 26 12/25/2017   GLUCOSE 99 12/25/2017   BUN 11 12/25/2017   CREATININE 0.82 12/25/2017   BILITOT 0.4 12/25/2017   ALKPHOS 46 12/25/2017   AST 18 12/25/2017   ALT 20 12/25/2017   PROT 7.2 12/25/2017   ALBUMIN 4.4 12/25/2017   CALCIUM 9.3 12/25/2017   GFR 81.42 12/25/2017   Lab Results  Component Value Date   CHOL 182 12/25/2017   Lab Results  Component Value Date   HDL 66.50 12/25/2017   Lab Results  Component Value Date   LDLCALC 98 12/25/2017   Lab Results  Component Value Date   TRIG 84.0 12/25/2017   Lab Results  Component Value Date   CHOLHDL 3 12/25/2017   No results found for: HGBA1C     Assessment & Plan:   Problem List Items Addressed This Visit    ADD (attention deficit disorder)    Refills given on Vyvanse.       Relevant Medications   lisdexamfetamine (VYVANSE) 50 MG capsule   lisdexamfetamine (VYVANSE) 50 MG capsule   lisdexamfetamine (VYVANSE) 50 MG capsule   Elevated blood pressure reading    Encouraged to get a weekly set of vitals and report any concerning numbers.       Hyperlipidemia    Encouraged heart healthy diet, increase exercise, avoid trans fats, consider a krill oil cap daily      Relevant Medications   metoprolol succinate (TOPROL-XL) 25 MG 24 hr tablet   Tachycardia    asymptomatic      OAB (overactive bladder)    Long history but recently worsened. Due to dry eyes will try skipping Oxybutynin and rx Myrebetriq 25 mg daily.         I have changed Georgina Peer Marsan's lisdexamfetamine,  lisdexamfetamine, and lisdexamfetamine. I am also having her start on mirabegron ER. Additionally, I am having her maintain her levonorgestrel and metoprolol succinate.  Meds ordered this encounter  Medications  . lisdexamfetamine (VYVANSE) 50 MG capsule    Sig: Take 1 capsule (50 mg total) by mouth daily. August 2020    Dispense:  30 capsule    Refill:  0  . lisdexamfetamine (VYVANSE) 50 MG capsule    Sig: Take 1 capsule (50 mg total) by mouth daily. July 2020    Dispense:  30 capsule    Refill:  0  . lisdexamfetamine (VYVANSE) 50 MG capsule    Sig: Take 1 capsule (50 mg total) by mouth daily. June 2020    Dispense:  30 capsule    Refill:  0  . metoprolol succinate (TOPROL-XL) 25 MG 24 hr tablet    Sig: Take 1 tablet (25 mg total) by mouth daily.    Dispense:  90 tablet    Refill:  1  . mirabegron ER (MYRBETRIQ) 25 MG TB24 tablet    Sig: Take 1 tablet (25 mg total) by mouth daily. Patient unable to use Oxybutynin due to dry eyes    Dispense:  30 tablet    Refill:  3    I discussed the assessment and treatment plan with the patient. The patient was provided an opportunity to ask questions and all were answered. The patient agreed with the plan and demonstrated an understanding of the instructions.   The patient was advised to call back  or seek an in-person evaluation if the symptoms worsen or if the condition fails to improve as anticipated.  I provided 25 minutes of non-face-to-face time during this encounter.   Penni Homans, MD l

## 2018-06-17 NOTE — Assessment & Plan Note (Signed)
Long history but recently worsened. Due to dry eyes will try skipping Oxybutynin and rx Myrebetriq 25 mg daily.

## 2018-06-17 NOTE — Assessment & Plan Note (Signed)
asymptomatic

## 2018-06-19 ENCOUNTER — Telehealth: Payer: Self-pay | Admitting: *Deleted

## 2018-06-19 NOTE — Telephone Encounter (Signed)
Can you start PA for this medication  Please advise

## 2018-06-19 NOTE — Telephone Encounter (Signed)
Copied from [place] [id number]. Topic: General - Other >> Jun 19, 2018 11:29 AM Carolyn Stare wrote:  Pt call to say the below med need a PA   mirabegron ER (MYRBETRIQ) 25 MG TB24 tablet  [phone]    Emily  fax number

## 2018-06-21 NOTE — Telephone Encounter (Deleted)
No active Rx coverage on file. Will need new insurance information.

## 2018-06-21 NOTE — Telephone Encounter (Signed)
PA initiated via Covermymeds; KEY: [id number]. Awaiting determination.

## 2018-06-24 ENCOUNTER — Telehealth: Payer: Self-pay | Admitting: Family Medicine

## 2018-06-24 DIAGNOSIS — Z79899 Other long term (current) drug therapy: Secondary | ICD-10-CM

## 2018-06-24 DIAGNOSIS — R350 Frequency of micturition: Secondary | ICD-10-CM

## 2018-06-24 DIAGNOSIS — E785 Hyperlipidemia, unspecified: Secondary | ICD-10-CM

## 2018-06-24 DIAGNOSIS — R000 Tachycardia, unspecified: Secondary | ICD-10-CM

## 2018-06-24 NOTE — Telephone Encounter (Signed)
UA and culture for urinary frequency, tsh, cbc, cmp for tachycardia, UDS and lipid

## 2018-06-24 NOTE — Telephone Encounter (Signed)
Patient requesting labs - please advise which labs to order and I will call the patient to schedule

## 2018-06-24 NOTE — Telephone Encounter (Signed)
Dr. Charlett Blake which labs did you want on this patient  Please advise

## 2018-06-25 NOTE — Telephone Encounter (Signed)
Labs ordered Please arrange lab appt

## 2018-06-25 NOTE — Telephone Encounter (Signed)
Called to schedule lab appointment. Unable to leave message.

## 2018-06-26 NOTE — Telephone Encounter (Signed)
Please let patient says she has to try something else and I do like Trospium XR 60 mg caps take one daily, disp #30 with 3 rf

## 2018-06-26 NOTE — Telephone Encounter (Signed)
PA denied. Preferred alternative: requires 30 day trial of oxybutynin (unable d/t dry eyes, however must also have 30 day trial within the last 180 days of tolterodine ER, trospium ER, or Gelnique.

## 2018-07-01 MED ORDER — TROSPIUM CHLORIDE ER 60 MG PO CP24: 1.0000 | ORAL_CAPSULE | Freq: Every day | ORAL | 3 refills | Status: DC

## 2018-07-04 ENCOUNTER — Other Ambulatory Visit: Payer: Self-pay | Admitting: Family Medicine

## 2018-07-04 ENCOUNTER — Telehealth: Payer: Self-pay

## 2018-07-04 NOTE — Telephone Encounter (Signed)
Already working on PA for [employer].

## 2018-07-04 NOTE — Telephone Encounter (Signed)
Will attempt another PA for Myrbetriq.

## 2018-07-04 NOTE — Telephone Encounter (Signed)
Please advise 

## 2018-07-04 NOTE — Telephone Encounter (Signed)
Pt stated that a rx for metoprolol was sent in today but she had already picked up a rx for a 90 day supply so she did not need another one. She is requesting that an appeal for the PA for mybetriq be done. Also stated that she tried the Trospium Chloride 60 MG CP24 but it caused dry eyes and she was unable to wear contacts. Please advise

## 2018-07-04 NOTE — Telephone Encounter (Signed)
PA initiated via Covermymeds; KEY: [id number]. Awaiting determination.

## 2018-07-04 NOTE — Telephone Encounter (Signed)
Pt called stating the Trospium is causing dry eye and her contacts are just peeling out of her eyes. She asked if this would be considered a "fail" and if the paperwork can be started for PA on Myrbetriq. Please advise.   [place] [place] [facility] [place] [phone] (Phone) [phone] (Fax)

## 2018-07-29 NOTE — Telephone Encounter (Signed)
Resent PA information again today.

## 2018-08-01 NOTE — Telephone Encounter (Signed)
Appeal letter faxed. Awaiting determination.

## 2018-08-01 NOTE — Telephone Encounter (Signed)
Received fax confirmation

## 2018-08-01 NOTE — Telephone Encounter (Signed)
Called [facility] at [phone] and spoke w/ Deidre to check status- they cancelled PA because they did not receive notes to support request. Appeal letter needs to be sent to [phone].

## 2018-08-26 NOTE — Telephone Encounter (Addendum)
Still have not received determination- letter faxed again today. Received fax confirmation.

## 2018-09-02 NOTE — Telephone Encounter (Signed)
Appeal cancelled because Pt never filled Tiospium sent to pharmacy on 07/02/2018.

## 2018-09-02 NOTE — Telephone Encounter (Signed)
Understood, please f/u on PA or what has to happen for patient to get her med.

## 2018-09-02 NOTE — Telephone Encounter (Signed)
Pt will have to pick up Tiospium and try for 30 days to even be considered for Myrbetriq.

## 2018-09-03 ENCOUNTER — Telehealth: Payer: Self-pay | Admitting: Family Medicine

## 2018-09-03 NOTE — Telephone Encounter (Signed)
Pt stated her mirabegron ER (MYRBETRIQ) 25 MG TB24 tablet has bee. enied because they have no proof of her taking Trospium Chloride 60 MG b/c pt was paying for this with  Her money. Pt need a letter sent to her insurance for an expedited appeals request. Pleased call pt to confirm what in needs.

## 2018-09-04 NOTE — Telephone Encounter (Signed)
Please advise on the information you have given me about her situation again

## 2018-09-04 NOTE — Telephone Encounter (Signed)
Received cancellation for the appeal because Pt's insurance did not have a claim that she ever picked up Trospium and took for at least 30 days within the last 180 days. However- Pt called saying she paid out of pocket for the Trospium- unsure how she will prove that to the insurance company- she should contact them directly to discuss.

## 2018-09-06 NOTE — Telephone Encounter (Signed)
Spoke with patient she stated she did so take the medication she never used her insurance she paid cash for the medication and has not been able to tolerate it  Please advise

## 2018-09-06 NOTE — Telephone Encounter (Signed)
She will need to discuss directly with insurance company.

## 2018-09-20 ENCOUNTER — Other Ambulatory Visit: Payer: Self-pay | Admitting: Family Medicine

## 2018-09-20 DIAGNOSIS — F988 Other specified behavioral and emotional disorders with onset usually occurring in childhood and adolescence: Secondary | ICD-10-CM

## 2018-09-20 NOTE — Telephone Encounter (Signed)
RX REFILL Lisdexamfetamine (VYVANSE) Metoprolo Succinate (TOPROL-XL) PHARMACY [facility] [id number] - [place], [place] - [address] AT [address] [phone] (Phone) [phone] (Fax)

## 2018-09-25 MED ORDER — LISDEXAMFETAMINE DIMESYLATE 50 MG PO CAPS: 50.0000 mg | ORAL_CAPSULE | Freq: Every day | ORAL | 0 refills | Status: DC

## 2018-09-25 NOTE — Telephone Encounter (Signed)
Last OV 06/17/18 vyvanse last filled 06/17/18 #30 with 3 prescriptions

## 2018-10-29 ENCOUNTER — Other Ambulatory Visit: Payer: Self-pay | Admitting: Family Medicine

## 2018-10-31 ENCOUNTER — Other Ambulatory Visit (INDEPENDENT_AMBULATORY_CARE_PROVIDER_SITE_OTHER): Payer: Managed Care, Other (non HMO)

## 2018-10-31 ENCOUNTER — Ambulatory Visit: Payer: Managed Care, Other (non HMO)

## 2018-10-31 ENCOUNTER — Other Ambulatory Visit: Payer: Self-pay

## 2018-10-31 ENCOUNTER — Telehealth: Payer: Self-pay

## 2018-10-31 DIAGNOSIS — R350 Frequency of micturition: Secondary | ICD-10-CM

## 2018-10-31 DIAGNOSIS — R000 Tachycardia, unspecified: Secondary | ICD-10-CM

## 2018-10-31 DIAGNOSIS — Z79899 Other long term (current) drug therapy: Secondary | ICD-10-CM

## 2018-10-31 DIAGNOSIS — R739 Hyperglycemia, unspecified: Secondary | ICD-10-CM

## 2018-10-31 DIAGNOSIS — E785 Hyperlipidemia, unspecified: Secondary | ICD-10-CM | POA: Diagnosis not present

## 2018-10-31 LAB — LIPID PANEL
Cholesterol: 170 mg/dL (ref 0–200)
HDL: 63.2 mg/dL (ref >39.00)
LDL Cholesterol: 96 mg/dL (ref 0–99)
NonHDL: 106.37
Total CHOL/HDL Ratio: 3
Triglycerides: 51 mg/dL (ref 0.0–149.0)
VLDL: 10.2 mg/dL (ref 0.0–40.0)

## 2018-10-31 LAB — URINALYSIS
Bilirubin Urine: NEGATIVE
Hgb urine dipstick: NEGATIVE
Ketones, ur: NEGATIVE
Leukocytes,Ua: NEGATIVE
Nitrite: NEGATIVE
Specific Gravity, Urine: 1.025 (ref 1.000–1.030)
Total Protein, Urine: NEGATIVE
Urine Glucose: NEGATIVE
Urobilinogen, UA: 0.2 (ref 0.0–1.0)
pH: 5.5 (ref 5.0–8.0)

## 2018-10-31 LAB — COMPREHENSIVE METABOLIC PANEL
ALT: 21 U/L (ref 0–35)
AST: 18 U/L (ref 0–37)
Albumin: 4.2 g/dL (ref 3.5–5.2)
Alkaline Phosphatase: 49 U/L (ref 39–117)
BUN: 12 mg/dL (ref 6–23)
CO2: 24 mEq/L (ref 19–32)
Calcium: 8.9 mg/dL (ref 8.4–10.5)
Chloride: 106 mEq/L (ref 96–112)
Creatinine, Ser: 0.81 mg/dL (ref 0.40–1.20)
GFR: 77.38 mL/min (ref >60.00)
Glucose, Bld: 117 mg/dL — ABNORMAL HIGH (ref 70–99)
Potassium: 4.3 mEq/L (ref 3.5–5.1)
Sodium: 136 mEq/L (ref 135–145)
Total Bilirubin: 0.5 mg/dL (ref 0.2–1.2)
Total Protein: 6.8 g/dL (ref 6.0–8.3)

## 2018-10-31 LAB — CBC
HCT: 42.1 % (ref 36.0–46.0)
Hemoglobin: 13.7 g/dL (ref 12.0–15.0)
MCHC: 32.6 g/dL (ref 30.0–36.0)
MCV: 83.1 fl (ref 78.0–100.0)
Platelets: 276 10*3/uL (ref 150.0–400.0)
RBC: 5.07 Mil/uL (ref 3.87–5.11)
RDW: 13.4 % (ref 11.5–15.5)
WBC: 5.6 10*3/uL (ref 4.0–10.5)

## 2018-10-31 LAB — HEMOGLOBIN A1C: Hgb A1c MFr Bld: 5.6 % (ref 4.6–6.5)

## 2018-10-31 LAB — TSH: TSH: 1.93 u[IU]/mL (ref 0.35–4.50)

## 2018-10-31 MED ORDER — TROSPIUM CHLORIDE 20 MG PO TABS: 20.0000 mg | ORAL_TABLET | Freq: Two times a day (BID) | ORAL | 3 refills | Status: DC

## 2018-10-31 NOTE — Telephone Encounter (Signed)
Patient notified

## 2018-10-31 NOTE — Telephone Encounter (Signed)
Trospium 20mg  bid tablets sent to [facility].

## 2018-10-31 NOTE — Telephone Encounter (Signed)
Can switch to Trospium 20 mg tab po bid and see if that works let her know her insurance is requiring it.

## 2018-10-31 NOTE — Telephone Encounter (Signed)
Insurance not covering Trospium 60mg  24hr capsules.   Step Therapy Req`d- Step 1 Options: Oxybutynin Tablets IR/ER; Trospium Tablets; Tolterodine IR Tablets

## 2018-11-03 LAB — PAIN MGMT, PROFILE 8 W/CONF, U
6 Acetylmorphine: NEGATIVE ng/mL
Alcohol Metabolites: POSITIVE ng/mL — AB (ref <500)
Amphetamine: 6418 ng/mL
Amphetamines: POSITIVE ng/mL
Benzodiazepines: NEGATIVE ng/mL
Buprenorphine, Urine: NEGATIVE ng/mL
Cocaine Metabolite: NEGATIVE ng/mL
Creatinine: 212.1 mg/dL
Ethyl Glucuronide (ETG): 39733 ng/mL
Ethyl Sulfate (ETS): 5848 ng/mL
MDMA: NEGATIVE ng/mL
Marijuana Metabolite: NEGATIVE ng/mL
Methamphetamine: NEGATIVE ng/mL
Opiates: NEGATIVE ng/mL
Oxidant: NEGATIVE ug/mL
Oxycodone: NEGATIVE ng/mL
pH: 5.8 (ref 4.5–9.0)

## 2018-11-03 LAB — URINE CULTURE
MICRO NUMBER:: 993637
Result:: NO GROWTH
SPECIMEN QUALITY:: ADEQUATE

## 2018-12-06 ENCOUNTER — Telehealth: Payer: Self-pay | Admitting: Family Medicine

## 2018-12-06 ENCOUNTER — Telehealth: Payer: Self-pay

## 2018-12-06 MED ORDER — MIRABEGRON ER 25 MG PO TB24: 25.0000 mg | ORAL_TABLET | Freq: Every day | ORAL | 5 refills | Status: DC

## 2018-12-06 NOTE — Telephone Encounter (Signed)
Good question ER is great if we can get it. thanks

## 2018-12-06 NOTE — Telephone Encounter (Signed)
Rx sent 

## 2018-12-06 NOTE — Telephone Encounter (Signed)
PA initiated via Covermymeds; [id number]. Awaiting determination.

## 2018-12-06 NOTE — Telephone Encounter (Signed)
Please advise 

## 2018-12-06 NOTE — Telephone Encounter (Signed)
Patient called about medication Trospium.20 mg. She stated she is still having dry mouth and dry eyes with this medication. She is requesting Myrbetriq. It has to be authorized by [employer]

## 2018-12-06 NOTE — Telephone Encounter (Signed)
OK to change her to Myrebetriq to 25 mg tabs, 1 tab po daily disp #30 with 5 rf and put on the prescription she failed Oxybutynin and Trospium

## 2018-12-06 NOTE — Telephone Encounter (Signed)
Do you want Myrbetriq ER 25mg  as you have tried to prescribe previously or just Myrbetriq 25mg ?

## 2018-12-19 NOTE — Telephone Encounter (Signed)
Received fax from [facility]- they are requesting chart notes for review. Will send last few OV notes as well as telephone notes describing issues w/ Trospium. Records faxed to [phone].

## 2018-12-21 ENCOUNTER — Other Ambulatory Visit: Payer: Self-pay | Admitting: Family Medicine

## 2018-12-23 ENCOUNTER — Other Ambulatory Visit: Payer: Self-pay | Admitting: Family Medicine

## 2018-12-23 DIAGNOSIS — F988 Other specified behavioral and emotional disorders with onset usually occurring in childhood and adolescence: Secondary | ICD-10-CM

## 2018-12-23 MED ORDER — LISDEXAMFETAMINE DIMESYLATE 50 MG PO CAPS: 50.0000 mg | ORAL_CAPSULE | Freq: Every day | ORAL | 0 refills | Status: DC

## 2018-12-23 NOTE — Telephone Encounter (Signed)
Requested medication (s) are due for refill today: yes  Requested medication (s) are on the active medication list:yes  Last refill:  09/25/2018  Future visit scheduled: yes  Notes to clinic:  Refill cannot be delegated    Requested Prescriptions  Pending Prescriptions Disp Refills   lisdexamfetamine (VYVANSE) 50 MG capsule 30 capsule 0    Sig: Take 1 capsule (50 mg total) by mouth daily. August 2020     Not Delegated - Psychiatry:  Stimulants/ADHD Failed - 12/23/2018 10:57 AM      Failed - This refill cannot be delegated      Failed - Valid encounter within last 3 months    Recent Outpatient Visits          6 months ago Attention deficit disorder, unspecified hyperactivity presence   [facility] [place], [place]   12 months ago Attention deficit disorder, unspecified hyperactivity presence   [facility], [place]   1 year ago Attention deficit hyperactivity disorder (ADHD), other type   [facility], [place]   1 year ago Hyperlipidemia, unspecified hyperlipidemia type   [facility], [place]   2 years ago Elevated blood pressure reading   [facility] [place], [place]      Future Appointments            In 1 week [name], MD [place] [facility] [place] completed in last 360 days.

## 2018-12-23 NOTE — Telephone Encounter (Signed)
Medication Refill - Medication: lisdexamfetamine (VYVANSE) 50 MG capsule    Has the patient contacted their pharmacy? Yes.   (Agent: If no, request that the patient contact the pharmacy for the refill.) (Agent: If yes, when and what did the pharmacy advise?)  Preferred Pharmacy (with phone number or street name):  [facility] [id number] - [place], [address]  [place] [address]  Phone: [phone] Fax: [phone]     Agent: Please be advised that RX refills may take up to 3 business days. We ask that you follow-up with your pharmacy.

## 2018-12-24 NOTE — Telephone Encounter (Signed)
PA again denied because coverage criteria has note been met. Coverage requires a 30 day trial of one of the following within the previous 180 days: tolterodine ER, trospium ER, or Gelnique. Pt did try trospium but did not complete a full 30 day trial d/t "dry eye" symptoms. Unsure what else can be done for this Pt to get this medication approved as we had tried to appeal previously.

## 2018-12-24 NOTE — Telephone Encounter (Signed)
Please let her know what her insurance is doing and see if she is willing to try one of the alternatives for 30 more days or does she want to use [facility] and see where it might be cheapest for her to pay cash and do it that way

## 2018-12-26 ENCOUNTER — Other Ambulatory Visit: Payer: Self-pay | Admitting: Family Medicine

## 2018-12-26 MED ORDER — TOLTERODINE TARTRATE ER 2 MG PO CP24: 2.0000 mg | ORAL_CAPSULE | Freq: Every day | ORAL | 3 refills | Status: DC

## 2018-12-26 NOTE — Telephone Encounter (Signed)
I have sent in the Detrol LA she agreed to but let her know I sent in the the 2 mg dose, if no significant side effects but inadequate response we can increase to 4 mg dose

## 2018-12-26 NOTE — Telephone Encounter (Signed)
Notified pt of below and she is willing to try Tolterodine. Please send RX.

## 2018-12-26 NOTE — Telephone Encounter (Signed)
Called patient left message for patient to call the office back  

## 2018-12-30 ENCOUNTER — Encounter: Payer: Managed Care, Other (non HMO) | Admitting: Family Medicine

## 2019-01-03 ENCOUNTER — Other Ambulatory Visit: Payer: Self-pay

## 2019-01-03 ENCOUNTER — Ambulatory Visit (INDEPENDENT_AMBULATORY_CARE_PROVIDER_SITE_OTHER): Payer: Managed Care, Other (non HMO) | Admitting: Family Medicine

## 2019-01-03 DIAGNOSIS — F988 Other specified behavioral and emotional disorders with onset usually occurring in childhood and adolescence: Secondary | ICD-10-CM

## 2019-01-03 DIAGNOSIS — N3281 Overactive bladder: Secondary | ICD-10-CM | POA: Diagnosis not present

## 2019-01-03 DIAGNOSIS — F908 Attention-deficit hyperactivity disorder, other type: Secondary | ICD-10-CM | POA: Diagnosis not present

## 2019-01-03 DIAGNOSIS — E785 Hyperlipidemia, unspecified: Secondary | ICD-10-CM

## 2019-01-03 MED ORDER — LISDEXAMFETAMINE DIMESYLATE 50 MG PO CAPS: 50.0000 mg | ORAL_CAPSULE | Freq: Every day | ORAL | 0 refills | Status: DC

## 2019-01-03 NOTE — Assessment & Plan Note (Signed)
maintain heart healthy diet, increase exercise, avoid trans fats, consider a krill oil cap daily.

## 2019-01-03 NOTE — Telephone Encounter (Signed)
Patient is requesting a 3 month supply of Vyvanse. Patient wanted to make a fasting lab appointment for 03/31/2018 please place orders for this appointment.

## 2019-01-06 NOTE — Progress Notes (Signed)
Patient ID: Katelyn [name], female   DOB: 1975-11-22, 43 y.o.   MRN: [id number] Virtual Visit via Video Note  I connected with Katelyn [name] on 01/03/19 at  8:20 AM EST by a video enabled telemedicine application and verified that I am speaking with the correct person using two identifiers.  Location: Patient: home Provider: home   I discussed the limitations of evaluation and management by telemedicine and the availability of in person appointments. The patient expressed understanding and agreed to proceed. CMA was able to get the patient set up on a video visit   Subjective:    Patient ID: Katelyn [name], female    DOB: 12/28/1975, 43 y.o.   MRN: [id number]  Chief Complaint  Patient presents with  . Annual Exam    HPI Patient is in today for follow up on chronic medical concerns including ADD, overactive bladder and more. She feels well today. No recent febrile illness or hospitalizations. She has just started Detrol LA to see if it helps with her urinary frequency and does note some dry mouth. Denies CP/palp/SOB/HA/congestion/fevers/GI c/o. Taking meds as prescribed  Past Medical History:  Diagnosis Date  . Acute bronchitis 01/03/2015  . ADD (attention deficit disorder)   . Anemia   . Anemia, iron deficiency 07/14/2013  . Chicken pox as a child  . Elevated blood pressure reading 08/11/2016  . Fibroid   . GERD (gastroesophageal reflux disease)   . H/O umbilical hernia repair 0000000  . Preventative health care 07/20/2013    Past Surgical History:  Procedure Laterality Date  . axillary cyst     left side  . MYOMECTOMY  2008  . WISDOM TOOTH EXTRACTION      Family History  Problem Relation Age of Onset  . Thyroid disease Mother   . Diabetes Maternal Grandfather        type 2  . Diabetes Paternal Grandmother        type 2  . Heart attack Paternal Grandfather     Social History   Socioeconomic History  . Marital status: Married    Spouse name: Not on file  .  Number of children: Not on file  . Years of education: Not on file  . Highest education level: Not on file  Occupational History  . Not on file  Tobacco Use  . Smoking status: Never Smoker  . Smokeless tobacco: Never Used  Substance and Sexual Activity  . Alcohol use: No  . Drug use: No  . Sexual activity: Yes    Birth control/protection: None    Comment: lives with husband and daughter, work at [employer]. No dietary restrictions, MD  Other Topics Concern  . Not on file  Social History Narrative  . Not on file   Social Determinants of Health   Financial Resource Strain:   . Difficulty of Paying Living Expenses: Not on file  Food Insecurity:   . Worried About Charity fundraiser in the Last Year: Not on file  . Ran Out of Food in the Last Year: Not on file  Transportation Needs:   . Lack of Transportation (Medical): Not on file  . Lack of Transportation (Non-Medical): Not on file  Physical Activity:   . Days of Exercise per Week: Not on file  . Minutes of Exercise per Session: Not on file  Stress:   . Feeling of Stress : Not on file  Social Connections:   . Frequency of Communication with Friends and Family: Not on file  .  Frequency of Social Gatherings with Friends and Family: Not on file  . Attends Religious Services: Not on file  . Active Member of Clubs or Organizations: Not on file  . Attends Archivist Meetings: Not on file  . Marital Status: Not on file  Intimate Partner Violence:   . Fear of Current or Ex-Partner: Not on file  . Emotionally Abused: Not on file  . Physically Abused: Not on file  . Sexually Abused: Not on file    Outpatient Medications Prior to Visit  Medication Sig Dispense Refill  . levonorgestrel (MIRENA) 20 MCG/24HR IUD 1 each by Intrauterine route once.    . lisdexamfetamine (VYVANSE) 50 MG capsule Take 1 capsule (50 mg total) by mouth daily. August 2020 30 capsule 0  . metoprolol succinate (TOPROL-XL) 25 MG 24 hr tablet  TAKE 1 TABLET(25 MG) BY MOUTH DAILY 90 tablet 1  . mirabegron ER (MYRBETRIQ) 25 MG TB24 tablet Take 1 tablet (25 mg total) by mouth daily. 30 tablet 5  . tolterodine (DETROL LA) 2 MG 24 hr capsule Take 1 capsule (2 mg total) by mouth daily. 30 capsule 3  . lisdexamfetamine (VYVANSE) 50 MG capsule Take 1 capsule (50 mg total) by mouth daily. June 2020 30 capsule 0  . lisdexamfetamine (VYVANSE) 50 MG capsule Take 1 capsule (50 mg total) by mouth daily. July 2020 30 capsule 0   No facility-administered medications prior to visit.    Allergies  Allergen Reactions  . Adhesive [Tape] Rash    Oxybutynin patch caused blistering rash    Review of Systems  Constitutional: Negative for fever and malaise/fatigue.  HENT: Negative for congestion.   Eyes: Negative for blurred vision.  Respiratory: Negative for shortness of breath.   Cardiovascular: Negative for chest pain, palpitations and leg swelling.  Gastrointestinal: Negative for abdominal pain, blood in stool and nausea.  Genitourinary: Positive for frequency. Negative for dysuria.  Musculoskeletal: Negative for falls.  Skin: Negative for rash.  Neurological: Negative for dizziness, loss of consciousness and headaches.  Endo/Heme/Allergies: Negative for environmental allergies.  Psychiatric/Behavioral: Negative for depression. The patient is not nervous/anxious.        Objective:    Physical Exam Constitutional:      Appearance: Normal appearance. She is not ill-appearing.  HENT:     Head: Normocephalic and atraumatic.     Nose: Nose normal.  Eyes:     General:        Right eye: No discharge.        Left eye: No discharge.  Pulmonary:     Effort: Pulmonary effort is normal.  Neurological:     Mental Status: She is alert and oriented to person, place, and time.  Psychiatric:        Mood and Affect: Mood normal.        Behavior: Behavior normal.     There were no vitals taken for this visit. Wt Readings from Last 3  Encounters:  06/17/18 150 lb (68 kg)  12/25/17 152 lb 3.2 oz (69 kg)  06/26/17 149 lb 9.6 oz (67.9 kg)    Diabetic Foot Exam - Simple   No data filed     Lab Results  Component Value Date   WBC 5.6 10/31/2018   HGB 13.7 10/31/2018   HCT 42.1 10/31/2018   PLT 276.0 10/31/2018   GLUCOSE 117 (H) 10/31/2018   CHOL 170 10/31/2018   TRIG 51.0 10/31/2018   HDL 63.20 10/31/2018   [place] 96 10/31/2018  ALT 21 10/31/2018   AST 18 10/31/2018   NA 136 10/31/2018   K 4.3 10/31/2018   CL 106 10/31/2018   CREATININE 0.81 10/31/2018   BUN 12 10/31/2018   CO2 24 10/31/2018   TSH 1.93 10/31/2018   HGBA1C 5.6 10/31/2018    Lab Results  Component Value Date   TSH 1.93 10/31/2018   Lab Results  Component Value Date   WBC 5.6 10/31/2018   HGB 13.7 10/31/2018   HCT 42.1 10/31/2018   MCV 83.1 10/31/2018   PLT 276.0 10/31/2018   Lab Results  Component Value Date   NA 136 10/31/2018   K 4.3 10/31/2018   CO2 24 10/31/2018   GLUCOSE 117 (H) 10/31/2018   BUN 12 10/31/2018   CREATININE 0.81 10/31/2018   BILITOT 0.5 10/31/2018   ALKPHOS 49 10/31/2018   AST 18 10/31/2018   ALT 21 10/31/2018   PROT 6.8 10/31/2018   ALBUMIN 4.2 10/31/2018   CALCIUM 8.9 10/31/2018   GFR 77.38 10/31/2018   Lab Results  Component Value Date   CHOL 170 10/31/2018   Lab Results  Component Value Date   HDL 63.20 10/31/2018   Lab Results  Component Value Date   LDLCALC 96 10/31/2018   Lab Results  Component Value Date   TRIG 51.0 10/31/2018   Lab Results  Component Value Date   CHOLHDL 3 10/31/2018   Lab Results  Component Value Date   HGBA1C 5.6 10/31/2018       Assessment & Plan:   Problem List Items Addressed This Visit    ADD (attention deficit disorder)    Doing well with current meds no changes.       Hyperlipidemia    maintain heart healthy diet, increase exercise, avoid trans fats, consider a krill oil cap daily.       OAB (overactive bladder)    Her insurance is  making her try Detrol LA, she has just started is experiencing some dry mouth but will continue for now.          I am having Roshelle Decapua maintain her levonorgestrel, lisdexamfetamine, mirabegron ER, metoprolol succinate, lisdexamfetamine, and tolterodine.  No orders of the defined types were placed in this encounter.    I discussed the assessment and treatment plan with the patient. The patient was provided an opportunity to ask questions and all were answered. The patient agreed with the plan and demonstrated an understanding of the instructions.   The patient was advised to call back or seek an in-person evaluation if the symptoms worsen or if the condition fails to improve as anticipated.  I provided 15 minutes of non-face-to-face time during this encounter.   Penni Homans, MD

## 2019-01-06 NOTE — Assessment & Plan Note (Signed)
Doing well with current meds no changes.

## 2019-01-06 NOTE — Assessment & Plan Note (Signed)
Her insurance is making her try Detrol LA, she has just started is experiencing some dry mouth but will continue for now.

## 2019-02-21 ENCOUNTER — Telehealth: Payer: Self-pay | Admitting: Family Medicine

## 2019-02-21 DIAGNOSIS — F988 Other specified behavioral and emotional disorders with onset usually occurring in childhood and adolescence: Secondary | ICD-10-CM

## 2019-02-21 NOTE — Telephone Encounter (Signed)
Requesting:vyvanse Contract:yes UDS:low risk  Last OV:01/21/19 Next OV:04/10/19 Last Refill: Database:   Please advise

## 2019-02-21 NOTE — Telephone Encounter (Signed)
Caller Name: Nainika Phone: [phone]  Medication: lisdexamfetamine (VYVANSE) 50 MG capsule - pt will be out 2/8 Pt requesting to send 3 months of RX at a time  Has the patient contacted their pharmacy? Yes - no more RXs on file  Preferred Pharmacy (with phone number or street name):  [id number] - [place], [place] Phone:  [phone]  Fax:  [phone]

## 2019-02-23 ENCOUNTER — Other Ambulatory Visit: Payer: Self-pay | Admitting: Family Medicine

## 2019-02-23 DIAGNOSIS — F988 Other specified behavioral and emotional disorders with onset usually occurring in childhood and adolescence: Secondary | ICD-10-CM

## 2019-02-23 MED ORDER — LISDEXAMFETAMINE DIMESYLATE 50 MG PO CAPS: 50.0000 mg | ORAL_CAPSULE | Freq: Every day | ORAL | 0 refills | Status: DC

## 2019-02-23 NOTE — Telephone Encounter (Signed)
Sent in 3 months

## 2019-03-28 ENCOUNTER — Other Ambulatory Visit: Payer: Self-pay

## 2019-03-28 ENCOUNTER — Telehealth: Payer: Self-pay | Admitting: *Deleted

## 2019-03-28 DIAGNOSIS — Z79899 Other long term (current) drug therapy: Secondary | ICD-10-CM

## 2019-03-28 DIAGNOSIS — D509 Iron deficiency anemia, unspecified: Secondary | ICD-10-CM

## 2019-03-28 DIAGNOSIS — E785 Hyperlipidemia, unspecified: Secondary | ICD-10-CM

## 2019-03-28 DIAGNOSIS — F988 Other specified behavioral and emotional disorders with onset usually occurring in childhood and adolescence: Secondary | ICD-10-CM

## 2019-03-28 NOTE — Telephone Encounter (Signed)
Future orders entered for 03/31/19 lab appt.

## 2019-03-31 ENCOUNTER — Other Ambulatory Visit: Payer: Self-pay

## 2019-03-31 ENCOUNTER — Other Ambulatory Visit (INDEPENDENT_AMBULATORY_CARE_PROVIDER_SITE_OTHER): Payer: Managed Care, Other (non HMO)

## 2019-03-31 DIAGNOSIS — D509 Iron deficiency anemia, unspecified: Secondary | ICD-10-CM

## 2019-03-31 DIAGNOSIS — E785 Hyperlipidemia, unspecified: Secondary | ICD-10-CM

## 2019-03-31 DIAGNOSIS — F988 Other specified behavioral and emotional disorders with onset usually occurring in childhood and adolescence: Secondary | ICD-10-CM

## 2019-03-31 DIAGNOSIS — Z79899 Other long term (current) drug therapy: Secondary | ICD-10-CM

## 2019-03-31 LAB — COMPREHENSIVE METABOLIC PANEL
ALT: 20 U/L (ref 0–35)
AST: 20 U/L (ref 0–37)
Albumin: 4 g/dL (ref 3.5–5.2)
Alkaline Phosphatase: 42 U/L (ref 39–117)
BUN: 13 mg/dL (ref 6–23)
CO2: 27 mEq/L (ref 19–32)
Calcium: 9.1 mg/dL (ref 8.4–10.5)
Chloride: 102 mEq/L (ref 96–112)
Creatinine, Ser: 0.74 mg/dL (ref 0.40–1.20)
GFR: 85.72 mL/min (ref >60.00)
Glucose, Bld: 96 mg/dL (ref 70–99)
Potassium: 4.5 mEq/L (ref 3.5–5.1)
Sodium: 136 mEq/L (ref 135–145)
Total Bilirubin: 0.4 mg/dL (ref 0.2–1.2)
Total Protein: 6.6 g/dL (ref 6.0–8.3)

## 2019-03-31 LAB — LIPID PANEL
Cholesterol: 182 mg/dL (ref 0–200)
HDL: 65.6 mg/dL (ref >39.00)
LDL Cholesterol: 103 mg/dL — ABNORMAL HIGH (ref 0–99)
NonHDL: 115.91
Total CHOL/HDL Ratio: 3
Triglycerides: 63 mg/dL (ref 0.0–149.0)
VLDL: 12.6 mg/dL (ref 0.0–40.0)

## 2019-03-31 LAB — CBC
HCT: 41.6 % (ref 36.0–46.0)
Hemoglobin: 13.8 g/dL (ref 12.0–15.0)
MCHC: 33.1 g/dL (ref 30.0–36.0)
MCV: 83.3 fl (ref 78.0–100.0)
Platelets: 273 10*3/uL (ref 150.0–400.0)
RBC: 5 Mil/uL (ref 3.87–5.11)
RDW: 13.2 % (ref 11.5–15.5)
WBC: 6.4 10*3/uL (ref 4.0–10.5)

## 2019-03-31 LAB — TSH: TSH: 2.33 u[IU]/mL (ref 0.35–4.50)

## 2019-04-02 LAB — PAIN MGMT, PROFILE 8 W/CONF, U
6 Acetylmorphine: NEGATIVE ng/mL
Alcohol Metabolites: POSITIVE ng/mL — AB (ref <500)
Amphetamine: 1039 ng/mL
Amphetamines: POSITIVE ng/mL
Benzodiazepines: NEGATIVE ng/mL
Buprenorphine, Urine: NEGATIVE ng/mL
Cocaine Metabolite: NEGATIVE ng/mL
Creatinine: 38.6 mg/dL
Ethyl Glucuronide (ETG): 2820 ng/mL
Ethyl Sulfate (ETS): 241 ng/mL
MDMA: NEGATIVE ng/mL
Marijuana Metabolite: NEGATIVE ng/mL
Methamphetamine: NEGATIVE ng/mL
Opiates: NEGATIVE ng/mL
Oxidant: NEGATIVE ug/mL
Oxycodone: NEGATIVE ng/mL
pH: 7.7 (ref 4.5–9.0)

## 2019-04-10 ENCOUNTER — Other Ambulatory Visit: Payer: Self-pay

## 2019-04-10 ENCOUNTER — Ambulatory Visit (INDEPENDENT_AMBULATORY_CARE_PROVIDER_SITE_OTHER): Payer: Managed Care, Other (non HMO) | Admitting: Family Medicine

## 2019-04-10 DIAGNOSIS — F908 Attention-deficit hyperactivity disorder, other type: Secondary | ICD-10-CM | POA: Diagnosis not present

## 2019-04-10 DIAGNOSIS — E785 Hyperlipidemia, unspecified: Secondary | ICD-10-CM | POA: Diagnosis not present

## 2019-04-10 DIAGNOSIS — N3281 Overactive bladder: Secondary | ICD-10-CM

## 2019-04-10 DIAGNOSIS — R000 Tachycardia, unspecified: Secondary | ICD-10-CM

## 2019-04-10 MED ORDER — METOPROLOL SUCCINATE ER 25 MG PO TB24: ORAL_TABLET | ORAL | 1 refills | Status: DC

## 2019-04-10 MED ORDER — MIRABEGRON ER 25 MG PO TB24: 25.0000 mg | ORAL_TABLET | Freq: Every day | ORAL | 5 refills | Status: DC

## 2019-04-13 NOTE — Assessment & Plan Note (Signed)
Failed Sanctura and Detrol. Is given a new prescription for Myrebetriq.

## 2019-04-13 NOTE — Assessment & Plan Note (Signed)
Encouraged heart healthy diet, increase exercise, avoid trans fats, consider a krill oil cap daily 

## 2019-04-13 NOTE — Progress Notes (Signed)
Subjective:    Patient ID: Katelyn [name], female    DOB: [birth year], 44 y.o.   MRN: [id number]  Chief Complaint  Patient presents with  . ADHD  . Hyperlipidemia    HPI Patient is in today for follow up on chronic medical concerns. She is doing well. No recent febrile illness or hospitalizations. She is still struggling with urinary frequency and urgency and is hoping to try and get Myrebetriq covered again. She had too many side effects on Detrol and [place]. Denies CP/palp/SOB/HA/congestion/fevers/GI c/o. Taking meds as prescribed  Past Medical History:  Diagnosis Date  . Acute bronchitis 01/03/2015  . ADD (attention deficit disorder)   . Anemia   . Anemia, iron deficiency 07/14/2013  . Chicken pox as a child  . Elevated blood pressure reading 08/11/2016  . Fibroid   . GERD (gastroesophageal reflux disease)   . H/O umbilical hernia repair 0000000  . Preventative health care 07/20/2013    Past Surgical History:  Procedure Laterality Date  . axillary cyst     left side  . MYOMECTOMY  2008  . WISDOM TOOTH EXTRACTION      Family History  Problem Relation Age of Onset  . Thyroid disease Mother   . Diabetes Maternal Grandfather        type 2  . Diabetes Paternal Grandmother        type 2  . Heart attack Paternal Grandfather     Social History   Socioeconomic History  . Marital status: Married    Spouse name: Not on file  . Number of children: Not on file  . Years of education: Not on file  . Highest education level: Not on file  Occupational History  . Not on file  Tobacco Use  . Smoking status: Never Smoker  . Smokeless tobacco: Never Used  Substance and Sexual Activity  . Alcohol use: No  . Drug use: No  . Sexual activity: Yes    Birth control/protection: None    Comment: lives with husband and daughter, work at [employer]. No dietary restrictions, MD  Other Topics Concern  . Not on file  Social History Narrative  . Not on file   Social  Determinants of Health   Financial Resource Strain:   . Difficulty of Paying Living Expenses:   Food Insecurity:   . Worried About Charity fundraiser in the Last Year:   . Arboriculturist in the Last Year:   Transportation Needs:   . Film/video editor (Medical):   Marland Kitchen Lack of Transportation (Non-Medical):   Physical Activity:   . Days of Exercise per Week:   . Minutes of Exercise per Session:   Stress:   . Feeling of Stress :   Social Connections:   . Frequency of Communication with Friends and Family:   . Frequency of Social Gatherings with Friends and Family:   . Attends Religious Services:   . Active Member of Clubs or Organizations:   . Attends Archivist Meetings:   Marland Kitchen Marital Status:   Intimate Partner Violence:   . Fear of Current or Ex-Partner:   . Emotionally Abused:   Marland Kitchen Physically Abused:   . Sexually Abused:     Outpatient Medications Prior to Visit  Medication Sig Dispense Refill  . levonorgestrel (MIRENA) 20 MCG/24HR IUD 1 each by Intrauterine route once.    . lisdexamfetamine (VYVANSE) 50 MG capsule Take 1 capsule (50 mg total) by mouth daily. April 2021  30 capsule 0  . lisdexamfetamine (VYVANSE) 50 MG capsule Take 1 capsule (50 mg total) by mouth daily. March 2021 30 capsule 0  . lisdexamfetamine (VYVANSE) 50 MG capsule Take 1 capsule (50 mg total) by mouth daily. February 2021 30 capsule 0  . tolterodine (DETROL LA) 2 MG 24 hr capsule Take 1 capsule (2 mg total) by mouth daily. 30 capsule 3  . metoprolol succinate (TOPROL-XL) 25 MG 24 hr tablet TAKE 1 TABLET(25 MG) BY MOUTH DAILY 90 tablet 1  . mirabegron ER (MYRBETRIQ) 25 MG TB24 tablet Take 1 tablet (25 mg total) by mouth daily. 30 tablet 5   No facility-administered medications prior to visit.    Allergies  Allergen Reactions  . Adhesive [Tape] Rash    Oxybutynin patch caused blistering rash    Review of Systems  Constitutional: Negative for fever and malaise/fatigue.  HENT: Negative  for congestion.   Eyes: Negative for blurred vision.  Respiratory: Negative for shortness of breath.   Cardiovascular: Negative for chest pain, palpitations and leg swelling.  Gastrointestinal: Negative for abdominal pain, blood in stool and nausea.  Genitourinary: Negative for dysuria and frequency.  Musculoskeletal: Negative for falls.  Skin: Negative for rash.  Neurological: Negative for dizziness, loss of consciousness and headaches.  Endo/Heme/Allergies: Negative for environmental allergies.  Psychiatric/Behavioral: Negative for depression. The patient is not nervous/anxious.        Objective:    Physical Exam Constitutional:      Appearance: Normal appearance. She is not ill-appearing.  HENT:     Head: Normocephalic and atraumatic.     Nose: Nose normal.  Eyes:     General:        Right eye: No discharge.        Left eye: No discharge.  Pulmonary:     Effort: Pulmonary effort is normal.  Neurological:     Mental Status: She is alert and oriented to person, place, and time.  Psychiatric:        Behavior: Behavior normal.     There were no vitals taken for this visit. Wt Readings from Last 3 Encounters:  06/17/18 150 lb (68 kg)  12/25/17 152 lb 3.2 oz (69 kg)  06/26/17 149 lb 9.6 oz (67.9 kg)    Diabetic Foot Exam - Simple   No data filed     Lab Results  Component Value Date   WBC 6.4 03/31/2019   HGB 13.8 03/31/2019   HCT 41.6 03/31/2019   PLT 273.0 03/31/2019   GLUCOSE 96 03/31/2019   CHOL 182 03/31/2019   TRIG 63.0 03/31/2019   HDL 65.60 03/31/2019   LDLCALC 103 (H) 03/31/2019   ALT 20 03/31/2019   AST 20 03/31/2019   NA 136 03/31/2019   K 4.5 03/31/2019   CL 102 03/31/2019   CREATININE 0.74 03/31/2019   BUN 13 03/31/2019   CO2 27 03/31/2019   TSH 2.33 03/31/2019   HGBA1C 5.6 10/31/2018    Lab Results  Component Value Date   TSH 2.33 03/31/2019   Lab Results  Component Value Date   WBC 6.4 03/31/2019   HGB 13.8 03/31/2019   HCT 41.6  03/31/2019   MCV 83.3 03/31/2019   PLT 273.0 03/31/2019   Lab Results  Component Value Date   NA 136 03/31/2019   K 4.5 03/31/2019   CO2 27 03/31/2019   GLUCOSE 96 03/31/2019   BUN 13 03/31/2019   CREATININE 0.74 03/31/2019   BILITOT 0.4 03/31/2019   ALKPHOS  42 03/31/2019   AST 20 03/31/2019   ALT 20 03/31/2019   PROT 6.6 03/31/2019   ALBUMIN 4.0 03/31/2019   CALCIUM 9.1 03/31/2019   GFR 85.72 03/31/2019   Lab Results  Component Value Date   CHOL 182 03/31/2019   Lab Results  Component Value Date   HDL 65.60 03/31/2019   Lab Results  Component Value Date   LDLCALC 103 (H) 03/31/2019   Lab Results  Component Value Date   TRIG 63.0 03/31/2019   Lab Results  Component Value Date   CHOLHDL 3 03/31/2019   Lab Results  Component Value Date   HGBA1C 5.6 10/31/2018       Assessment & Plan:   Problem List Items Addressed This Visit    ADD (attention deficit disorder)    Doing well on Vyvanse, no change in dosing.       Hyperlipidemia    Encouraged heart healthy diet, increase exercise, avoid trans fats, consider a krill oil cap daily      Relevant Medications   metoprolol succinate (TOPROL-XL) 25 MG 24 hr tablet   Tachycardia    Well controlled on current meds. No changes      OAB (overactive bladder)    Failed Sanctura and Detrol. Is given a new prescription for Myrebetriq.          I am having Natlie Joost maintain her levonorgestrel, tolterodine, lisdexamfetamine, lisdexamfetamine, lisdexamfetamine, mirabegron ER, and metoprolol succinate.  Meds ordered this encounter  Medications  . mirabegron ER (MYRBETRIQ) 25 MG TB24 tablet    Sig: Take 1 tablet (25 mg total) by mouth daily.    Dispense:  30 tablet    Refill:  5    Patient failed [place], Detrol LA with dry eyes, dry mouth  . metoprolol succinate (TOPROL-XL) 25 MG 24 hr tablet    Sig: TAKE 1 TABLET(25 MG) BY MOUTH DAILY    Dispense:  90 tablet    Refill:  1     Penni Homans, MD

## 2019-04-13 NOTE — Assessment & Plan Note (Signed)
Well controlled on current meds. No changes

## 2019-04-13 NOTE — Assessment & Plan Note (Signed)
Doing well on Vyvanse, no change in dosing.

## 2019-05-06 ENCOUNTER — Telehealth: Payer: Self-pay

## 2019-05-06 ENCOUNTER — Other Ambulatory Visit: Payer: Self-pay | Admitting: Family Medicine

## 2019-05-06 DIAGNOSIS — F988 Other specified behavioral and emotional disorders with onset usually occurring in childhood and adolescence: Secondary | ICD-10-CM

## 2019-05-06 MED ORDER — LISDEXAMFETAMINE DIMESYLATE 50 MG PO CAPS: 50.0000 mg | ORAL_CAPSULE | Freq: Every day | ORAL | 0 refills | Status: DC

## 2019-05-06 NOTE — Telephone Encounter (Signed)
According to the chart she has an April prescription so I sent in a May, June, July prescriptions

## 2019-05-06 NOTE — Telephone Encounter (Signed)
Requesting:vyvanse Contract:n/a UDS:3.15.21 Last Visit:3.25.21 Next Visit: 7.13.21 Last Refill:2.7.21  Please Advise

## 2019-05-06 NOTE — Telephone Encounter (Signed)
Patient called in to see if Dr Charlett Blake could send in a prescription for  lisdexamfetamine (VYVANSE) 50 MG capsule [id number]    mirabegron ER (MYRBETRIQ) 25 MG TB24 tablet [id number]   Please send it to [facility], [address]  [place], [place] [address]  Phone:  [phone] Fax:  [phone]  DEA #:  [id number]

## 2019-05-07 NOTE — Telephone Encounter (Signed)
Patient notified rxs sent in.    Myrbetriq has refills on it already.

## 2019-05-08 ENCOUNTER — Other Ambulatory Visit: Payer: Self-pay | Admitting: Family Medicine

## 2019-06-21 ENCOUNTER — Other Ambulatory Visit: Payer: Self-pay | Admitting: Family Medicine

## 2019-07-02 ENCOUNTER — Telehealth: Payer: Self-pay

## 2019-07-02 MED ORDER — METOPROLOL SUCCINATE ER 25 MG PO TB24: 25.0000 mg | ORAL_TABLET | Freq: Every day | ORAL | 0 refills | Status: DC

## 2019-07-02 NOTE — Telephone Encounter (Signed)
Pt called and stated she is on vacation in [place] and forgot to pack her Metoprolol.  She is requesting a week's worth to get her through until she can get back to [facility].  Please send to [facility] at [address], Ph # [phone].  Pt states she is pretty sure insurance will not cover it, so she will will pay out of pocket for it if necessary.

## 2019-07-02 NOTE — Telephone Encounter (Signed)
Patient notified that [facility] did not have pharmacy and she wanted rx sent to [facility].  Rx sent in.

## 2019-07-29 ENCOUNTER — Other Ambulatory Visit: Payer: Self-pay

## 2019-07-29 ENCOUNTER — Ambulatory Visit: Payer: Managed Care, Other (non HMO) | Admitting: Family Medicine

## 2019-07-29 ENCOUNTER — Encounter: Payer: Self-pay | Admitting: Family Medicine

## 2019-07-29 VITALS — BP 108/78 | HR 68 | Temp 98.2°F | Resp 12 | Ht 63.0 in | Wt 160.6 lb

## 2019-07-29 DIAGNOSIS — K219 Gastro-esophageal reflux disease without esophagitis: Secondary | ICD-10-CM

## 2019-07-29 DIAGNOSIS — E785 Hyperlipidemia, unspecified: Secondary | ICD-10-CM

## 2019-07-29 DIAGNOSIS — F988 Other specified behavioral and emotional disorders with onset usually occurring in childhood and adolescence: Secondary | ICD-10-CM | POA: Diagnosis not present

## 2019-07-29 DIAGNOSIS — R000 Tachycardia, unspecified: Secondary | ICD-10-CM

## 2019-07-29 DIAGNOSIS — F908 Attention-deficit hyperactivity disorder, other type: Secondary | ICD-10-CM

## 2019-07-29 DIAGNOSIS — N3281 Overactive bladder: Secondary | ICD-10-CM

## 2019-07-29 MED ORDER — LISDEXAMFETAMINE DIMESYLATE 50 MG PO CAPS: 50.0000 mg | ORAL_CAPSULE | Freq: Every day | ORAL | 0 refills | Status: DC

## 2019-07-29 MED ORDER — FAMOTIDINE 40 MG PO TABS: 40.0000 mg | ORAL_TABLET | Freq: Every evening | ORAL | 1 refills | Status: DC | PRN

## 2019-07-29 MED ORDER — MIRABEGRON ER 50 MG PO TB24: 50.0000 mg | ORAL_TABLET | Freq: Every day | ORAL | 1 refills | Status: DC

## 2019-07-29 MED ORDER — METOPROLOL SUCCINATE ER 25 MG PO TB24: 25.0000 mg | ORAL_TABLET | Freq: Every day | ORAL | 1 refills | Status: DC

## 2019-07-29 NOTE — Assessment & Plan Note (Signed)
Doing well on current meds.

## 2019-07-29 NOTE — Patient Instructions (Signed)

## 2019-07-30 DIAGNOSIS — K219 Gastro-esophageal reflux disease without esophagitis: Secondary | ICD-10-CM | POA: Insufficient documentation

## 2019-07-30 NOTE — Assessment & Plan Note (Signed)
Encouraged heart healthy diet, increase exercise, avoid trans fats, consider a krill oil cap daily 

## 2019-07-30 NOTE — Assessment & Plan Note (Signed)
Much better control on Myrbetriq 50 mg than 25 mg

## 2019-07-30 NOTE — Assessment & Plan Note (Signed)
RRR today 

## 2019-08-02 NOTE — Progress Notes (Signed)
Subjective:    Patient ID: Katelyn [name], female    DOB: [birth year], 44 y.o.   MRN: [id number]  Chief Complaint  Patient presents with  . Follow-up    HPI Patient is in today for follow up on chronic medical concerns. No recent febrile illness or hospitalizations. She is noting some increased trouble with reflux.  She has had trouble in the past but it has become more frequent and intense. No change in bowel habits. She increased the Myrebtriq to 50 mg and it has been much better at controlling her OAB symptoms. Her Vyvanse is working well. Denies CP/palp/SOB/HA/congestion/fevers/GI or GU c/o. Taking meds as prescribed  Past Medical History:  Diagnosis Date  . Acute bronchitis 01/03/2015  . ADD (attention deficit disorder)   . Anemia   . Anemia, iron deficiency 07/14/2013  . Chicken pox as a child  . Elevated blood pressure reading 08/11/2016  . Fibroid   . GERD (gastroesophageal reflux disease)   . H/O umbilical hernia repair 40/98/1191  . Preventative health care 07/20/2013    Past Surgical History:  Procedure Laterality Date  . axillary cyst     left side  . MYOMECTOMY  2008  . WISDOM TOOTH EXTRACTION      Family History  Problem Relation Age of Onset  . Thyroid disease Mother   . Diabetes Maternal Grandfather        type 2  . Diabetes Paternal Grandmother        type 2  . Heart attack Paternal Grandfather     Social History   Socioeconomic History  . Marital status: Married    Spouse name: Not on file  . Number of children: Not on file  . Years of education: Not on file  . Highest education level: Not on file  Occupational History  . Not on file  Tobacco Use  . Smoking status: Never Smoker  . Smokeless tobacco: Never Used  Vaping Use  . Vaping Use: Never used  Substance and Sexual Activity  . Alcohol use: No  . Drug use: No  . Sexual activity: Yes    Birth control/protection: None    Comment: lives with husband and daughter, work at [employer].  No dietary restrictions, MD  Other Topics Concern  . Not on file  Social History Narrative  . Not on file   Social Determinants of Health   Financial Resource Strain:   . Difficulty of Paying Living Expenses:   Food Insecurity:   . Worried About Charity fundraiser in the Last Year:   . Arboriculturist in the Last Year:   Transportation Needs:   . Film/video editor (Medical):   Marland Kitchen Lack of Transportation (Non-Medical):   Physical Activity:   . Days of Exercise per Week:   . Minutes of Exercise per Session:   Stress:   . Feeling of Stress :   Social Connections:   . Frequency of Communication with Friends and Family:   . Frequency of Social Gatherings with Friends and Family:   . Attends Religious Services:   . Active Member of Clubs or Organizations:   . Attends Archivist Meetings:   Marland Kitchen Marital Status:   Intimate Partner Violence:   . Fear of Current or Ex-Partner:   . Emotionally Abused:   Marland Kitchen Physically Abused:   . Sexually Abused:     Outpatient Medications Prior to Visit  Medication Sig Dispense Refill  . levonorgestrel (MIRENA) 20 MCG/24HR  IUD 1 each by Intrauterine route once.    . lisdexamfetamine (VYVANSE) 50 MG capsule Take 1 capsule (50 mg total) by mouth daily. june 2021 30 capsule 0  . lisdexamfetamine (VYVANSE) 50 MG capsule Take 1 capsule (50 mg total) by mouth daily. May 2021 30 capsule 0  . lisdexamfetamine (VYVANSE) 50 MG capsule Take 1 capsule (50 mg total) by mouth daily. July 2021 30 capsule 0  . metoprolol succinate (TOPROL-XL) 25 MG 24 hr tablet TAKE 1 TABLET(25 MG) BY MOUTH DAILY 90 tablet 1  . mirabegron ER (MYRBETRIQ) 25 MG TB24 tablet Take 1 tablet (25 mg total) by mouth daily. 30 tablet 5   No facility-administered medications prior to visit.    Allergies  Allergen Reactions  . Adhesive [Tape] Rash    Oxybutynin patch caused blistering rash    Review of Systems  Constitutional: Negative for fever and malaise/fatigue.  HENT:  Negative for congestion.   Eyes: Negative for blurred vision.  Respiratory: Negative for shortness of breath.   Cardiovascular: Negative for chest pain, palpitations and leg swelling.  Gastrointestinal: Positive for heartburn. Negative for abdominal pain, blood in stool and nausea.  Genitourinary: Negative for dysuria and frequency.  Musculoskeletal: Negative for falls.  Skin: Negative for rash.  Neurological: Negative for dizziness, loss of consciousness and headaches.  Endo/Heme/Allergies: Negative for environmental allergies.  Psychiatric/Behavioral: Negative for depression. The patient is not nervous/anxious.        Objective:    Physical Exam Vitals and nursing note reviewed.  Constitutional:      General: She is not in acute distress.    Appearance: She is well-developed.  HENT:     Head: Normocephalic and atraumatic.     Nose: Nose normal.  Eyes:     General:        Right eye: No discharge.        Left eye: No discharge.  Cardiovascular:     Rate and Rhythm: Normal rate and regular rhythm.     Heart sounds: No murmur heard.   Pulmonary:     Effort: Pulmonary effort is normal.     Breath sounds: Normal breath sounds.  Abdominal:     General: Bowel sounds are normal.     Palpations: Abdomen is soft.     Tenderness: There is no abdominal tenderness.  Musculoskeletal:     Cervical back: Normal range of motion and neck supple.  Skin:    General: Skin is warm and dry.  Neurological:     Mental Status: She is alert and oriented to person, place, and time.     BP 108/78 (BP Location: Right Arm, Cuff Size: Large)   Pulse 68   Temp 98.2 F (36.8 C) (Temporal)   Resp 12   Ht 5\' 3"  (1.6 m)   Wt 160 lb 9.6 oz (72.8 kg)   SpO2 98%   BMI 28.45 kg/m  Wt Readings from Last 3 Encounters:  07/29/19 160 lb 9.6 oz (72.8 kg)  06/17/18 150 lb (68 kg)  12/25/17 152 lb 3.2 oz (69 kg)    Diabetic Foot Exam - Simple   No data filed     Lab Results  Component Value  Date   WBC 6.4 03/31/2019   HGB 13.8 03/31/2019   HCT 41.6 03/31/2019   PLT 273.0 03/31/2019   GLUCOSE 96 03/31/2019   CHOL 182 03/31/2019   TRIG 63.0 03/31/2019   HDL 65.60 03/31/2019   LDLCALC 103 (H) 03/31/2019   ALT  20 03/31/2019   AST 20 03/31/2019   NA 136 03/31/2019   K 4.5 03/31/2019   CL 102 03/31/2019   CREATININE 0.74 03/31/2019   BUN 13 03/31/2019   CO2 27 03/31/2019   TSH 2.33 03/31/2019   HGBA1C 5.6 10/31/2018    Lab Results  Component Value Date   TSH 2.33 03/31/2019   Lab Results  Component Value Date   WBC 6.4 03/31/2019   HGB 13.8 03/31/2019   HCT 41.6 03/31/2019   MCV 83.3 03/31/2019   PLT 273.0 03/31/2019   Lab Results  Component Value Date   NA 136 03/31/2019   K 4.5 03/31/2019   CO2 27 03/31/2019   GLUCOSE 96 03/31/2019   BUN 13 03/31/2019   CREATININE 0.74 03/31/2019   BILITOT 0.4 03/31/2019   ALKPHOS 42 03/31/2019   AST 20 03/31/2019   ALT 20 03/31/2019   PROT 6.6 03/31/2019   ALBUMIN 4.0 03/31/2019   CALCIUM 9.1 03/31/2019   GFR 85.72 03/31/2019   Lab Results  Component Value Date   CHOL 182 03/31/2019   Lab Results  Component Value Date   HDL 65.60 03/31/2019   Lab Results  Component Value Date   LDLCALC 103 (H) 03/31/2019   Lab Results  Component Value Date   TRIG 63.0 03/31/2019   Lab Results  Component Value Date   CHOLHDL 3 03/31/2019   Lab Results  Component Value Date   HGBA1C 5.6 10/31/2018       Assessment & Plan:   Problem List Items Addressed This Visit    ADD (attention deficit disorder)    Doing well on current meds.       Relevant Medications   lisdexamfetamine (VYVANSE) 50 MG capsule   lisdexamfetamine (VYVANSE) 50 MG capsule   lisdexamfetamine (VYVANSE) 50 MG capsule   Hyperlipidemia    Encouraged heart healthy diet, increase exercise, avoid trans fats, consider a krill oil cap daily      Relevant Medications   metoprolol succinate (TOPROL-XL) 25 MG 24 hr tablet   Tachycardia    RRR  today      OAB (overactive bladder)    Much better control on Myrbetriq 50 mg than 25 mg       Gastroesophageal reflux disease - Primary    Avoid offending foods, start probiotics. Do not eat large meals in late evening and consider raising head of bed. Add Famotidine qhs. Referred to gastroenterology for further consideration.       Relevant Medications   famotidine (PEPCID) 40 MG tablet   Other Relevant Orders   Ambulatory referral to Gastroenterology      I have discontinued Georgina Peer Braunschweig's mirabegron ER. I have also changed her lisdexamfetamine, lisdexamfetamine, lisdexamfetamine, and metoprolol succinate. Additionally, I am having her start on famotidine and mirabegron ER. Lastly, I am having her maintain her levonorgestrel.  Meds ordered this encounter  Medications  . famotidine (PEPCID) 40 MG tablet    Sig: Take 1 tablet (40 mg total) by mouth at bedtime as needed for heartburn or indigestion.    Dispense:  90 tablet    Refill:  1  . lisdexamfetamine (VYVANSE) 50 MG capsule    Sig: Take 1 capsule (50 mg total) by mouth daily. October 2021    Dispense:  30 capsule    Refill:  0  . lisdexamfetamine (VYVANSE) 50 MG capsule    Sig: Take 1 capsule (50 mg total) by mouth daily. September 2021    Dispense:  30  capsule    Refill:  0  . lisdexamfetamine (VYVANSE) 50 MG capsule    Sig: Take 1 capsule (50 mg total) by mouth daily. August 2021    Dispense:  30 capsule    Refill:  0  . metoprolol succinate (TOPROL-XL) 25 MG 24 hr tablet    Sig: Take 1 tablet (25 mg total) by mouth daily.    Dispense:  90 tablet    Refill:  1  . mirabegron ER (MYRBETRIQ) 50 MG TB24 tablet    Sig: Take 1 tablet (50 mg total) by mouth daily.    Dispense:  90 tablet    Refill:  1     Penni Homans, MD

## 2019-08-02 NOTE — Assessment & Plan Note (Addendum)
Avoid offending foods, start probiotics. Do not eat large meals in late evening and consider raising head of bed. Add Famotidine qhs. Referred to gastroenterology for further consideration.

## 2019-08-11 ENCOUNTER — Other Ambulatory Visit: Payer: Self-pay | Admitting: Family Medicine

## 2019-08-11 ENCOUNTER — Telehealth: Payer: Self-pay | Admitting: Family Medicine

## 2019-08-11 DIAGNOSIS — F988 Other specified behavioral and emotional disorders with onset usually occurring in childhood and adolescence: Secondary | ICD-10-CM

## 2019-08-11 MED ORDER — LISDEXAMFETAMINE DIMESYLATE 50 MG PO CAPS: 50.0000 mg | ORAL_CAPSULE | Freq: Every day | ORAL | 0 refills | Status: DC

## 2019-08-11 NOTE — Telephone Encounter (Signed)
Medication: lisdexamfetamine (VYVANSE) 50 MG capsule [[id number]]   Has the patient contacted their pharmacy? No. (If no, request that the patient contact the pharmacy for the refill.) (If yes, when and what did the pharmacy advise?)  Preferred Pharmacy (with phone number or street name): [id number] - [place], [place] [address]  [place], [place] [address]  Phone:  [phone] Fax:  [phone]  DEA #:  [id number]  Agent: Please be advised that RX refills may take up to 3 business days. We ask that you follow-up with your pharmacy.

## 2019-08-11 NOTE — Telephone Encounter (Signed)
Spoke with pharmacy and it looks like they filled the July one for her May fill by mistake.  Per pharmacy need to put a fill date on rxs (ex: fill on or after 08/11/19).  We just need an rx for July please.

## 2019-08-11 NOTE — Telephone Encounter (Signed)
done

## 2019-08-11 NOTE — Telephone Encounter (Signed)
Patient is calling back to clarify that when she came in for her appointment on 07/29/19 you called in 3 months  August,September and October skipping the month of July. The pharmacy would not fill a medication because each prescription has specific month.

## 2019-08-12 NOTE — Telephone Encounter (Signed)
Patient notified that rx is ready

## 2019-08-13 LAB — HM PAP SMEAR: HM Pap smear: NEGATIVE

## 2019-08-13 LAB — HM MAMMOGRAPHY

## 2019-08-13 LAB — RESULTS CONSOLE HPV: CHL HPV: NEGATIVE

## 2019-10-10 ENCOUNTER — Other Ambulatory Visit: Payer: Self-pay | Admitting: Family Medicine

## 2019-10-10 ENCOUNTER — Encounter: Payer: Self-pay | Admitting: Family Medicine

## 2019-10-31 ENCOUNTER — Other Ambulatory Visit: Payer: Self-pay | Admitting: Family Medicine

## 2019-11-28 ENCOUNTER — Other Ambulatory Visit: Payer: Self-pay | Admitting: Family Medicine

## 2019-12-04 ENCOUNTER — Telehealth: Payer: Self-pay | Admitting: Family Medicine

## 2019-12-04 ENCOUNTER — Other Ambulatory Visit: Payer: Self-pay

## 2019-12-04 ENCOUNTER — Other Ambulatory Visit: Payer: Self-pay | Admitting: Family Medicine

## 2019-12-04 DIAGNOSIS — F988 Other specified behavioral and emotional disorders with onset usually occurring in childhood and adolescence: Secondary | ICD-10-CM

## 2019-12-04 MED ORDER — LISDEXAMFETAMINE DIMESYLATE 50 MG PO CAPS: 50.0000 mg | ORAL_CAPSULE | Freq: Every day | ORAL | 0 refills | Status: DC

## 2019-12-04 NOTE — Telephone Encounter (Signed)
Provider routed encounter

## 2019-12-04 NOTE — Telephone Encounter (Signed)
Refills sent in

## 2019-12-04 NOTE — Telephone Encounter (Signed)
Medication: lisdexamfetamine (VYVANSE) 50 MG capsule    Has the patient contacted their pharmacy? No. (If no, request that the patient contact the pharmacy for the refill.) (If yes, when and what did the pharmacy advise?)  Preferred Pharmacy (with phone number or street name): [id number] - [place], [place] - [address]  [place], [place] [address]  Phone:  [phone] Fax:  [phone]  DEA #:  [id number]  Agent: Please be advised that RX refills may take up to 3 business days. We ask that you follow-up with your pharmacy.

## 2019-12-04 NOTE — Telephone Encounter (Signed)
Requesting:(VYVANSE) 50 MG capsule Contract:10/01/19 UDS:10/01/19 Last Visit:07/29/19 Next Visit:02/02/20 Last Refill:08/11/19  Please Advise

## 2020-01-23 ENCOUNTER — Other Ambulatory Visit: Payer: Self-pay | Admitting: Family Medicine

## 2020-01-26 ENCOUNTER — Encounter: Payer: Self-pay | Admitting: Gastroenterology

## 2020-01-27 ENCOUNTER — Encounter: Payer: Self-pay | Admitting: Gastroenterology

## 2020-02-02 ENCOUNTER — Encounter: Payer: Self-pay | Admitting: Family Medicine

## 2020-02-02 ENCOUNTER — Other Ambulatory Visit: Payer: Self-pay

## 2020-02-02 ENCOUNTER — Telehealth (INDEPENDENT_AMBULATORY_CARE_PROVIDER_SITE_OTHER): Payer: Managed Care, Other (non HMO) | Admitting: Family Medicine

## 2020-02-02 DIAGNOSIS — K219 Gastro-esophageal reflux disease without esophagitis: Secondary | ICD-10-CM

## 2020-02-02 DIAGNOSIS — N3281 Overactive bladder: Secondary | ICD-10-CM

## 2020-02-02 DIAGNOSIS — F908 Attention-deficit hyperactivity disorder, other type: Secondary | ICD-10-CM

## 2020-02-02 DIAGNOSIS — E785 Hyperlipidemia, unspecified: Secondary | ICD-10-CM | POA: Diagnosis not present

## 2020-02-02 DIAGNOSIS — F988 Other specified behavioral and emotional disorders with onset usually occurring in childhood and adolescence: Secondary | ICD-10-CM

## 2020-02-02 DIAGNOSIS — R000 Tachycardia, unspecified: Secondary | ICD-10-CM

## 2020-02-02 MED ORDER — METOPROLOL SUCCINATE ER 25 MG PO TB24: 25.0000 mg | ORAL_TABLET | Freq: Every day | ORAL | 1 refills | Status: DC

## 2020-02-02 MED ORDER — LISDEXAMFETAMINE DIMESYLATE 50 MG PO CAPS: 50.0000 mg | ORAL_CAPSULE | Freq: Every day | ORAL | 0 refills | Status: DC

## 2020-02-02 MED ORDER — MIRABEGRON ER 50 MG PO TB24
ORAL_TABLET | ORAL | 1 refills | Status: DC
Start: 2020-02-02 — End: 2020-05-13

## 2020-02-02 NOTE — Assessment & Plan Note (Signed)
Encouraged heart healthy diet, increase exercise, avoid trans fats, consider a krill oil cap daily 

## 2020-02-02 NOTE — Assessment & Plan Note (Signed)
Avoid offending foods, start probiotics. Do not eat large meals in late evening and consider raising head of bed. Continue Pepcid prn

## 2020-02-02 NOTE — Progress Notes (Signed)
Virtual Visit via phone Note  I connected with Katelyn [name] on 02/02/20 at  8:20 AM EST by a [hone enabled telemedicine application and verified that I am speaking with the correct person using two identifiers.  Location: Patient: work, patient and provider in visit Provider: home   I discussed the limitations of evaluation and management by telemedicine and the availability of in person appointments. The patient expressed understanding and agreed to proceed. K Canter, CMA  was able to get the patient set up on a phone visit after being unable to set up a video visit     Subjective:    Patient ID: Katelyn [name], female    DOB: February 25, 1975, 45 y.o.   MRN: [id number]  Chief Complaint  Patient presents with  . Gastroesophageal Reflux  . Hyperlipidemia  . ADD    HPI Patient is in today for follow up on chronic medical concerns. No recent febrile illness or hospitalizations. Her family got COVID after she was exposed at work. She did well and has no long symptoms. Her family is also doing well. No change in meds needed. She is doing well on each. Denies CP/palp/SOB/HA/congestion/fevers/GI or GU c/o. Taking meds as prescribed  Past Medical History:  Diagnosis Date  . Acute bronchitis 01/03/2015  . ADD (attention deficit disorder)   . Anemia   . Anemia, iron deficiency 07/14/2013  . Chicken pox as a child  . Elevated blood pressure reading 08/11/2016  . Fibroid   . GERD (gastroesophageal reflux disease)   . H/O umbilical hernia repair 73/71/0626  . Preventative health care 07/20/2013    Past Surgical History:  Procedure Laterality Date  . axillary cyst     left side  . MYOMECTOMY  2008  . WISDOM TOOTH EXTRACTION      Family History  Problem Relation Age of Onset  . Thyroid disease Mother   . Diabetes Maternal Grandfather        type 2  . Diabetes Paternal Grandmother        type 2  . Heart attack Paternal Grandfather     Social History   Socioeconomic History  .  Marital status: Married    Spouse name: Not on file  . Number of children: Not on file  . Years of education: Not on file  . Highest education level: Not on file  Occupational History  . Not on file  Tobacco Use  . Smoking status: Never Smoker  . Smokeless tobacco: Never Used  Vaping Use  . Vaping Use: Never used  Substance and Sexual Activity  . Alcohol use: No  . Drug use: No  . Sexual activity: Yes    Birth control/protection: None    Comment: lives with husband and daughter, work at [employer]. No dietary restrictions, MD  Other Topics Concern  . Not on file  Social History Narrative  . Not on file   Social Determinants of Health   Financial Resource Strain: Not on file  Food Insecurity: Not on file  Transportation Needs: Not on file  Physical Activity: Not on file  Stress: Not on file  Social Connections: Not on file  Intimate Partner Violence: Not on file    Outpatient Medications Prior to Visit  Medication Sig Dispense Refill  . famotidine (PEPCID) 40 MG tablet Take 1 tablet (40 mg total) by mouth at bedtime as needed for heartburn or indigestion. 90 tablet 3  . levonorgestrel (MIRENA) 20 MCG/24HR IUD 1 each by Intrauterine route once.    Marland Kitchen  lisdexamfetamine (VYVANSE) 50 MG capsule Take 1 capsule (50 mg total) by mouth daily. January 2022 30 capsule 0  . lisdexamfetamine (VYVANSE) 50 MG capsule Take 1 capsule (50 mg total) by mouth daily. Decemberr 2021 30 capsule 0  . lisdexamfetamine (VYVANSE) 50 MG capsule Take 1 capsule (50 mg total) by mouth daily. November 2021 30 capsule 0  . metoprolol succinate (TOPROL-XL) 25 MG 24 hr tablet Take 1 tablet (25 mg total) by mouth daily. 90 tablet 1  . MYRBETRIQ 50 MG TB24 tablet TAKE 1 TABLET(50 MG) BY MOUTH DAILY 90 tablet 1   No facility-administered medications prior to visit.    Allergies  Allergen Reactions  . Adhesive [Tape] Rash    Oxybutynin patch caused blistering rash    Review of Systems   Constitutional: Negative for fever and malaise/fatigue.  HENT: Negative for congestion.   Eyes: Negative for blurred vision.  Respiratory: Negative for shortness of breath.   Cardiovascular: Negative for chest pain, palpitations and leg swelling.  Gastrointestinal: Negative for abdominal pain, blood in stool and nausea.  Genitourinary: Negative for dysuria and frequency.  Musculoskeletal: Negative for falls.  Skin: Negative for rash.  Neurological: Negative for dizziness, loss of consciousness and headaches.  Endo/Heme/Allergies: Negative for environmental allergies.  Psychiatric/Behavioral: Negative for depression. The patient is not nervous/anxious.        Objective:    Physical Exam  Unable to obtain via phone visit Ht 5\' 3"  (1.6 m)   BMI 28.45 kg/m  Wt Readings from Last 3 Encounters:  07/29/19 160 lb 9.6 oz (72.8 kg)  06/17/18 150 lb (68 kg)  12/25/17 152 lb 3.2 oz (69 kg)    Diabetic Foot Exam - Simple   No data filed    Lab Results  Component Value Date   WBC 6.4 03/31/2019   HGB 13.8 03/31/2019   HCT 41.6 03/31/2019   PLT 273.0 03/31/2019   GLUCOSE 96 03/31/2019   CHOL 182 03/31/2019   TRIG 63.0 03/31/2019   HDL 65.60 03/31/2019   LDLCALC 103 (H) 03/31/2019   ALT 20 03/31/2019   AST 20 03/31/2019   NA 136 03/31/2019   K 4.5 03/31/2019   CL 102 03/31/2019   CREATININE 0.74 03/31/2019   BUN 13 03/31/2019   CO2 27 03/31/2019   TSH 2.33 03/31/2019   HGBA1C 5.6 10/31/2018    Lab Results  Component Value Date   TSH 2.33 03/31/2019   Lab Results  Component Value Date   WBC 6.4 03/31/2019   HGB 13.8 03/31/2019   HCT 41.6 03/31/2019   MCV 83.3 03/31/2019   PLT 273.0 03/31/2019   Lab Results  Component Value Date   NA 136 03/31/2019   K 4.5 03/31/2019   CO2 27 03/31/2019   GLUCOSE 96 03/31/2019   BUN 13 03/31/2019   CREATININE 0.74 03/31/2019   BILITOT 0.4 03/31/2019   ALKPHOS 42 03/31/2019   AST 20 03/31/2019   ALT 20 03/31/2019   PROT  6.6 03/31/2019   ALBUMIN 4.0 03/31/2019   CALCIUM 9.1 03/31/2019   GFR 85.72 03/31/2019   Lab Results  Component Value Date   CHOL 182 03/31/2019   Lab Results  Component Value Date   HDL 65.60 03/31/2019   Lab Results  Component Value Date   LDLCALC 103 (H) 03/31/2019   Lab Results  Component Value Date   TRIG 63.0 03/31/2019   Lab Results  Component Value Date   CHOLHDL 3 03/31/2019   Lab Results  Component Value Date   HGBA1C 5.6 10/31/2018       Assessment & Plan:   Problem List Items Addressed This Visit    ADD (attention deficit disorder)    Doing well on current dose of Vyvanse. Refills given      Relevant Medications   lisdexamfetamine (VYVANSE) 50 MG capsule   lisdexamfetamine (VYVANSE) 50 MG capsule   lisdexamfetamine (VYVANSE) 50 MG capsule   Hyperlipidemia    Encouraged heart healthy diet, increase exercise, avoid trans fats, consider a krill oil cap daily      Relevant Medications   metoprolol succinate (TOPROL-XL) 25 MG 24 hr tablet   Tachycardia    Well controlled on Metoprolol. No changes refills given      OAB (overactive bladder)    Doing well on Myrebetriq refills given.       Gastroesophageal reflux disease    Avoid offending foods, start probiotics. Do not eat large meals in late evening and consider raising head of bed. Continue Pepcid prn         I have changed Georgina Peer Ginyard's Myrbetriq to mirabegron ER. I have also changed her lisdexamfetamine, lisdexamfetamine, and lisdexamfetamine. I am also having her maintain her levonorgestrel, famotidine, and metoprolol succinate.  Meds ordered this encounter  Medications  . lisdexamfetamine (VYVANSE) 50 MG capsule    Sig: Take 1 capsule (50 mg total) by mouth daily. April 2022    Dispense:  30 capsule    Refill:  0  . lisdexamfetamine (VYVANSE) 50 MG capsule    Sig: Take 1 capsule (50 mg total) by mouth daily. March 2022    Dispense:  30 capsule    Refill:  0  .  lisdexamfetamine (VYVANSE) 50 MG capsule    Sig: Take 1 capsule (50 mg total) by mouth daily. February 2022    Dispense:  30 capsule    Refill:  0  . metoprolol succinate (TOPROL-XL) 25 MG 24 hr tablet    Sig: Take 1 tablet (25 mg total) by mouth daily.    Dispense:  90 tablet    Refill:  1  . mirabegron ER (MYRBETRIQ) 50 MG TB24 tablet    Sig: TAKE 1 TABLET(50 MG) BY MOUTH DAILY    Dispense:  90 tablet    Refill:  1    ZERO refills remain on this prescription. Your patient is requesting advance approval of refills for this medication to [facility]      I discussed the assessment and treatment plan with the patient. The patient was provided an opportunity to ask questions and all were answered. The patient agreed with the plan and demonstrated an understanding of the instructions.   The patient was advised to call back or seek an in-person evaluation if the symptoms worsen or if the condition fails to improve as anticipated.  I provided 25 minutes of non-face-to-face time during this encounter.   Penni Homans, MD

## 2020-02-02 NOTE — Assessment & Plan Note (Signed)
Well controlled on Metoprolol. No changes refills given

## 2020-02-02 NOTE — Assessment & Plan Note (Signed)
Doing well on Myrebetriq refills given.

## 2020-02-02 NOTE — Assessment & Plan Note (Signed)
Doing well on current dose of Vyvanse. Refills given

## 2020-02-19 ENCOUNTER — Encounter: Payer: Self-pay | Admitting: Gastroenterology

## 2020-02-19 ENCOUNTER — Ambulatory Visit: Payer: Managed Care, Other (non HMO) | Admitting: Gastroenterology

## 2020-02-19 VITALS — BP 110/76 | HR 84 | Ht 63.0 in | Wt 163.5 lb

## 2020-02-19 DIAGNOSIS — K219 Gastro-esophageal reflux disease without esophagitis: Secondary | ICD-10-CM | POA: Diagnosis not present

## 2020-02-19 DIAGNOSIS — R142 Eructation: Secondary | ICD-10-CM | POA: Diagnosis not present

## 2020-02-19 NOTE — Progress Notes (Signed)
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   She reports a long-standing hx of reflux symptoms since early 30's.  Index sxs of belching, HB, regurgitation. Belching is most bothersome and most frequent.  No dysphagia.  Worse with coffee, EtOH, overeating, spicy, supine.  Has heartburn/regurgitation sxs 2-3 days/week, but belching daily.   Started OTC omeprazole BID with good clinical response. Stopped Rx and sxs returned. Restarted with control, and decreasd to daily, again with good control and eventually weaned to QOD and off, and transition to Pepcid 40 mg QOD in AM.   Father with ?GIB in 1960's with gastrectomy/vagotomy.  Also with colon polyps.  No other known family history of GI malignancy, IBD, hepatobiliary disease, pancreatic disease.  Colonoscopy many years ago for hemorrhoidal sxs. No prior EGD.    Past Medical History:  Diagnosis Date  . Acute bronchitis 01/03/2015  . ADD (attention deficit disorder)   . Anemia   . Anemia, iron deficiency 07/14/2013  . Chicken pox as a child  . Elevated blood pressure reading 08/11/2016  . Fibroid   . GERD (gastroesophageal reflux disease)   . H/O umbilical hernia repair 42/70/6237  . Preventative health care 07/20/2013     Past Surgical History:  Procedure Laterality Date  . axillary cyst     left side  . MYOMECTOMY  2008  . WISDOM TOOTH EXTRACTION     Family History  Problem Relation Age of Onset  . Thyroid disease Mother   . Colon polyps Father   . Ulcers Father        back in the 1960's thinks he had part of his stomach removed  . Diabetes Maternal Grandfather        type 2  . Diabetes Paternal Grandmother        type 2  . Heart attack Paternal Grandfather   . Colon cancer Neg Hx   .  Esophageal cancer Neg Hx    Social History   Tobacco Use  . Smoking status: Never Smoker  . Smokeless tobacco: Never Used  Vaping Use  . Vaping Use: Never used  Substance Use Topics  . Alcohol use: Yes    Comment: ocassionally   . Drug use: No   Current Outpatient Medications  Medication Sig Dispense Refill  . famotidine (PEPCID) 40 MG tablet Take 1 tablet (40 mg total) by mouth at bedtime as needed for heartburn or indigestion. 90 tablet 3  . levonorgestrel (MIRENA) 20 MCG/24HR IUD 1 each by Intrauterine route once.    . lisdexamfetamine (VYVANSE) 50 MG capsule Take 1 capsule (50 mg total) by mouth daily. April 2022 30 capsule 0  . metoprolol succinate (TOPROL-XL) 25 MG 24 hr tablet Take 1 tablet (25 mg total) by mouth daily. 90 tablet 1  . mirabegron ER (MYRBETRIQ) 50 MG TB24 tablet TAKE 1 TABLET(50 MG) BY MOUTH DAILY 90 tablet 1   No current facility-administered medications for this visit.   Allergies  Allergen Reactions  . Adhesive [Tape] Rash    Oxybutynin patch caused blistering rash     Review of Systems: All systems reviewed and negative except where noted in HPI.     Physical Exam:  Wt Readings from Last 3 Encounters:  02/19/20 163 lb 8 oz (74.2 kg)  07/29/19 160 lb 9.6 oz (72.8 kg)  06/17/18 150 lb (68 kg)    BP 110/76   Pulse 84   Ht 5\' 3"  (1.6 m)   Wt 163 lb 8 oz (74.2 kg)   BMI 28.96 kg/m  Constitutional:  Pleasant, in no acute distress. Psychiatric: Normal mood and affect. Behavior is normal. EENT: Pupils normal.  Conjunctivae are normal. No scleral icterus. Neck supple. No cervical LAD. Cardiovascular: Normal rate, regular rhythm. No edema Pulmonary/chest: Effort normal and breath sounds normal. No wheezing, rales or rhonchi. Abdominal: Soft, nondistended, nontender. Bowel sounds active throughout. There are no masses palpable. No hepatomegaly. Neurological: Alert and oriented to person place and time. Skin: Skin is warm and dry. No rashes  noted.   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  Has both typical symptoms (heartburn, regurgitation) and atypical symptoms (belching), of which the belching is much more regular and bothersome.  Good clinical response to trial of PPI, and she transitioned to H2 RA.  She is interested in seeking alternative treatment options rather than long-term acid suppression therapy, if possible.  Plan as below:  -EGD to evaluate for erosive esophagitis, LES laxity, hiatal hernia -Briefly discussed antireflux surgical options, to include Nissen and Transoral Incisionless Fundoplication (TIF) -Okay to resume H2RA -Gastric biopsies to rule out H. pylori -Continue antireflux lifestyle/dietary modifications  The indications, risks, and benefits of EGD were explained to the patient in detail. Risks include but are not limited to bleeding, perforation, adverse reaction to medications, and cardiopulmonary compromise. Sequelae include but are not limited to the possibility of surgery, hospitalization, and mortality. The patient verbalized understanding and wished to proceed. All questions answered, referred to scheduler. Further recommendations pending results of the exam.     Lavena Bullion, DO, FACG  02/19/2020, 2:30 PM   [name], MD

## 2020-02-19 NOTE — Patient Instructions (Signed)
If you are age 44 or older, your body mass index should be between 23-30. Your Body mass index is 28.96 kg/m. If this is out of the aforementioned range listed, please consider follow up with your Primary Care Provider.  If you are age 41 or younger, your body mass index should be between 19-25. Your Body mass index is 28.96 kg/m. If this is out of the aformentioned range listed, please consider follow up with your Primary Care Provider.   Due to recent changes in healthcare laws, you may see the results of your imaging and laboratory studies on MyChart before your provider has had a chance to review them.  We understand that in some cases there may be results that are confusing or concerning to you. Not all laboratory results come back in the same time frame and the provider may be waiting for multiple results in order to interpret others.  Please give [place] 48 hours in order for your provider to thoroughly review all the results before contacting the office for clarification of your results.   Thank you for choosing me and [facility].  Vito Cirigliano, D.O.

## 2020-03-23 ENCOUNTER — Encounter: Payer: Self-pay | Admitting: Gastroenterology

## 2020-04-05 ENCOUNTER — Ambulatory Visit (AMBULATORY_SURGERY_CENTER): Payer: Managed Care, Other (non HMO) | Admitting: Gastroenterology

## 2020-04-05 ENCOUNTER — Encounter: Payer: Self-pay | Admitting: Gastroenterology

## 2020-04-05 ENCOUNTER — Other Ambulatory Visit: Payer: Self-pay

## 2020-04-05 VITALS — BP 124/89 | HR 81 | Temp 98.0°F | Resp 27 | Ht 63.0 in | Wt 163.0 lb

## 2020-04-05 DIAGNOSIS — K319 Disease of stomach and duodenum, unspecified: Secondary | ICD-10-CM | POA: Diagnosis not present

## 2020-04-05 DIAGNOSIS — K2100 Gastro-esophageal reflux disease with esophagitis, without bleeding: Secondary | ICD-10-CM

## 2020-04-05 DIAGNOSIS — K2990 Gastroduodenitis, unspecified, without bleeding: Secondary | ICD-10-CM

## 2020-04-05 DIAGNOSIS — K2970 Gastritis, unspecified, without bleeding: Secondary | ICD-10-CM | POA: Diagnosis not present

## 2020-04-05 DIAGNOSIS — K317 Polyp of stomach and duodenum: Secondary | ICD-10-CM

## 2020-04-05 DIAGNOSIS — K3189 Other diseases of stomach and duodenum: Secondary | ICD-10-CM

## 2020-04-05 DIAGNOSIS — R142 Eructation: Secondary | ICD-10-CM

## 2020-04-05 MED ORDER — FAMOTIDINE 40 MG PO TABS: 40.0000 mg | ORAL_TABLET | Freq: Two times a day (BID) | ORAL | 3 refills | Status: DC

## 2020-04-05 MED ORDER — SODIUM CHLORIDE 0.9 % IV SOLN: 500.0000 mL | INTRAVENOUS | Status: DC

## 2020-04-05 NOTE — Op Note (Signed)
[place] Patient Name: Katelyn [name] Procedure Date: 04/05/2020 9:35 AM MRN: [id number] Endoscopist: Gerrit Heck , MD Age: 45 Referring MD:  Date of Birth: 15-Oct-1975 Gender: Female Account #: [id number] Procedure:                Upper GI endoscopy Indications:              Heartburn, Suspected esophageal reflux, Eructation                           45 yo female with a long-standing hx of reflux                            symptoms since early 30's, with index symptoms of                            belching, heartburn, regurgitation. No dysphagia.                            Previously treated with PPI, and currently taking                            Pepcid 40 mg QAM, with Tums for breakthrough                            symptoms. Medicines:                Monitored Anesthesia Care Procedure:                Pre-Anesthesia Assessment:                           - Prior to the procedure, a History and Physical                            was performed, and patient medications and                            allergies were reviewed. The patient's tolerance of                            previous anesthesia was also reviewed. The risks                            and benefits of the procedure and the sedation                            options and risks were discussed with the patient.                            All questions were answered, and informed consent                            was obtained. Prior Anticoagulants: The patient has  taken no previous anticoagulant or antiplatelet                            agents. ASA Grade Assessment: II - A patient with                            mild systemic disease. After reviewing the risks                            and benefits, the patient was deemed in                            satisfactory condition to undergo the procedure.                           After obtaining informed consent, the endoscope  was                            passed under direct vision. Throughout the                            procedure, the patient's blood pressure, pulse, and                            oxygen saturations were monitored continuously. The                            Endoscope was introduced through the mouth, and                            advanced to the second part of duodenum. The upper                            GI endoscopy was accomplished without difficulty.                            The patient tolerated the procedure well. Scope In: Scope Out: Findings:                 LA Grade A (one or more mucosal breaks less than 5                            mm, not extending between tops of 2 mucosal folds)                            esophagitis with no bleeding was found 37 cm from                            the incisors.                           One tongue of salmon-colored mucosa was present at  37 cm. No other visible abnormalities were present.                            The maximum longitudinal extent of these esophageal                            mucosal changes was 0.5 cm in length. Biopsies were                            taken from the salmon colored tongue, as well as                            the remainder of the Z line in 4 quadrants with a                            cold forceps for histology. Estimated blood loss                            was minimal.                           The upper third of the esophagus and middle third                            of the esophagus were normal.                           The gastroesophageal flap valve was visualized                            endoscopically and classified as Hill Grade III                            (minimal fold, loose to endoscope). There was no                            axial hiatal hernia appreciated on prolonged                            anterograde views.                           Localized  mild inflammation characterized by                            erythema was found in the gastric antrum. Biopsies                            were taken with a cold forceps for Helicobacter                            pylori testing. Estimated blood loss was minimal.  Multiple small sessile polyps with no bleeding and                            no stigmata of recent bleeding were found in the                            gastric fundus and in the gastric body. These                            polyps were removed with a cold biopsy forceps.                            Resection and retrieval were complete. Estimated                            blood loss was minimal.                           Localized mildly erythematous mucosa without active                            bleeding was found in the duodenal bulb. Biopsies                            were taken with a cold forceps for histology.                            Estimated blood loss was minimal.                           The second portion of the duodenum was normal. Complications:            No immediate complications. Estimated Blood Loss:     Estimated blood loss was minimal. Impression:               - LA Grade A reflux esophagitis with no bleeding.                           - Salmon-colored mucosa. Biopsied.                           - Normal upper third of esophagus and middle third                            of esophagus.                           - Gastroesophageal flap valve classified as Hill                            Grade III (minimal fold, loose to endoscope, hiatal                            hernia likely).                           -  Gastritis. Biopsied.                           - Multiple gastric polyps. Resected and retrieved.                           - Erythematous duodenopathy. Biopsied.                           - Normal second portion of the duodenum. Recommendation:           - Patient has a  contact number available for                            emergencies. The signs and symptoms of potential                            delayed complications were discussed with the                            patient. Return to normal activities tomorrow.                            Written discharge instructions were provided to the                            patient.                           - Resume previous diet.                           - Continue present medications.                           - Await pathology results.                           - Return to GI clinic at appointment to be                            scheduled. Will plan to discuss ongoing medical                            management of reflux vs antireflux surgical options                            pending biopsy results and response to therapy.                           - Increase Pepcid (famotidine) to 40 mg PO BID for                            6 weeks to promote mucosal healing, then reduce  back to 40 mg daily. Gerrit Heck, MD 04/05/2020 10:11:52 AM

## 2020-04-05 NOTE — Progress Notes (Signed)
Pt's states no medical or surgical changes since previsit or office visit. 

## 2020-04-05 NOTE — Progress Notes (Signed)
Called to room to assist during endoscopic procedure.  Patient ID and intended procedure confirmed with present staff. Received instructions for my participation in the procedure from the performing physician.  

## 2020-04-05 NOTE — Patient Instructions (Signed)
Handout given for gastritis   Pick up RX, pepcid bid x 6 weeks then daily    YOU HAD AN ENDOSCOPIC PROCEDURE TODAY AT [facility]:   Refer to the procedure report that was given to you for any specific questions about what was found during the examination.  If the procedure report does not answer your questions, please call your gastroenterologist to clarify.  If you requested that your care partner not be given the details of your procedure findings, then the procedure report has been included in a sealed envelope for you to review at your convenience later.  YOU SHOULD EXPECT: Some feelings of bloating in the abdomen. Passage of more gas than usual.  Walking can help get rid of the air that was put into your GI tract during the procedure and reduce the bloating. If you had a lower endoscopy (such as a colonoscopy or flexible sigmoidoscopy) you may notice spotting of blood in your stool or on the toilet paper. If you underwent a bowel prep for your procedure, you may not have a normal bowel movement for a few days.  Please Note:  You might notice some irritation and congestion in your nose or some drainage.  This is from the oxygen used during your procedure.  There is no need for concern and it should clear up in a day or so.  SYMPTOMS TO REPORT IMMEDIATELY:   Following upper endoscopy (EGD)  Vomiting of blood or coffee ground material  New chest pain or pain under the shoulder blades  Painful or persistently difficult swallowing  New shortness of breath  Fever of 100F or higher  Black, tarry-looking stools  For urgent or emergent issues, a gastroenterologist can be reached at any hour by calling [phone]. Do not use MyChart messaging for urgent concerns.    DIET:  We do recommend a small meal at first, but then you may proceed to your regular diet.  Drink plenty of fluids but you should avoid alcoholic beverages for 24 hours.  ACTIVITY:  You should plan to take  it easy for the rest of today and you should NOT DRIVE or use heavy machinery until tomorrow (because of the sedation medicines used during the test).    FOLLOW UP: Our staff will call the number listed on your records 48-72 hours following your procedure to check on you and address any questions or concerns that you may have regarding the information given to you following your procedure. If we do not reach you, we will leave a message.  We will attempt to reach you two times.  During this call, we will ask if you have developed any symptoms of COVID 19. If you develop any symptoms (ie: fever, flu-like symptoms, shortness of breath, cough etc.) before then, please call [phone].  If you test positive for Covid 19 in the 2 weeks post procedure, please call and report this information to [place].    If any biopsies were taken you will be contacted by phone or by letter within the next 1-3 weeks.  Please call us at [phone] if you have not heard about the biopsies in 3 weeks.    SIGNATURES/CONFIDENTIALITY: You and/or your care partner have signed paperwork which will be entered into your electronic medical record.  These signatures attest to the fact that that the information above on your After Visit Summary has been reviewed and is understood.  Full responsibility of the confidentiality of this discharge information lies  with you and/or your care-partner.

## 2020-04-05 NOTE — Progress Notes (Signed)
To PACU, VSS. Report to [email or web address] 

## 2020-04-06 ENCOUNTER — Telehealth: Payer: Self-pay

## 2020-04-06 NOTE — Telephone Encounter (Signed)
Per 04/05/20 procedure note - Return to GI clinic at appointment to be scheduled.  Patient has been scheduled for a follow up on Thursday, 05/06/20 at 8:20 AM. Sent via My Chart and letter mailed to patient.

## 2020-04-07 ENCOUNTER — Telehealth: Payer: Self-pay | Admitting: *Deleted

## 2020-04-07 NOTE — Telephone Encounter (Signed)
  Follow up Call-  Call back number 04/05/2020  Post procedure Call Back phone  # [phone]  Permission to leave phone message Yes  Some recent data might be hidden     Patient questions:  Do you have a fever, pain , or abdominal swelling? No. Pain Score  0 *  Have you tolerated food without any problems? Yes.    Have you been able to return to your normal activities? Yes.    Do you have any questions about your discharge instructions: Diet   No. Medications  No. Follow up visit  No.  Do you have questions or concerns about your Care? No.  Actions: * If pain score is 4 or above: 1. No action needed, pain <4.Have you developed a fever since your procedure? no  2.   Have you had an respiratory symptoms (SOB or cough) since your procedure? no  3.   Have you tested positive for COVID 19 since your procedure no  4.   Have you had any family members/close contacts diagnosed with the COVID 19 since your procedure?  no   If yes to any of these questions please route to Joylene John, RN and Joella Prince, RN

## 2020-04-15 ENCOUNTER — Encounter: Payer: Self-pay | Admitting: Gastroenterology

## 2020-05-06 ENCOUNTER — Ambulatory Visit: Payer: Managed Care, Other (non HMO) | Admitting: Gastroenterology

## 2020-05-12 ENCOUNTER — Ambulatory Visit (INDEPENDENT_AMBULATORY_CARE_PROVIDER_SITE_OTHER): Payer: Managed Care, Other (non HMO) | Admitting: Gastroenterology

## 2020-05-12 ENCOUNTER — Other Ambulatory Visit: Payer: Self-pay

## 2020-05-12 ENCOUNTER — Encounter: Payer: Self-pay | Admitting: Gastroenterology

## 2020-05-12 ENCOUNTER — Other Ambulatory Visit: Payer: Self-pay | Admitting: Gastroenterology

## 2020-05-12 VITALS — BP 146/96 | HR 92 | Ht 63.0 in | Wt 160.0 lb

## 2020-05-12 DIAGNOSIS — R142 Eructation: Secondary | ICD-10-CM | POA: Diagnosis not present

## 2020-05-12 DIAGNOSIS — K2100 Gastro-esophageal reflux disease with esophagitis, without bleeding: Secondary | ICD-10-CM | POA: Diagnosis not present

## 2020-05-12 MED ORDER — PANTOPRAZOLE SODIUM 40 MG PO TBEC: 40.0000 mg | DELAYED_RELEASE_TABLET | Freq: Two times a day (BID) | ORAL | 0 refills | Status: DC

## 2020-05-12 MED ORDER — PANTOPRAZOLE SODIUM 40 MG PO TBEC: 40.0000 mg | DELAYED_RELEASE_TABLET | Freq: Every day | ORAL | 3 refills | Status: DC

## 2020-05-12 NOTE — Patient Instructions (Addendum)
-   If you would like to view an informative video regarding the TIF procedure, please visit:  [email or web address]   If you are age 44 or older, your body mass index should be between 23-30. Your Body mass index is 28.34 kg/m. If this is out of the aforementioned range listed, please consider follow up with your Primary Care Provider.  If you are age 51 or younger, your body mass index should be between 19-25. Your Body mass index is 28.34 kg/m. If this is out of the aformentioned range listed, please consider follow up with your Primary Care Provider.    We have sent the following medications to your pharmacy for you to pick up at your convenience:  Protonix  Due to recent changes in healthcare laws, you may see the results of your imaging and laboratory studies on MyChart before your provider has had a chance to review them.  We understand that in some cases there may be results that are confusing or concerning to you. Not all laboratory results come back in the same time frame and the provider may be waiting for multiple results in order to interpret others.  Please give [place] 48 hours in order for your provider to thoroughly review all the results before contacting the office for clarification of your results.   Thank you for choosing me and [facility].  Shantee Hayne, D.O.

## 2020-05-12 NOTE — Progress Notes (Signed)
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  Index sxs of belching, HB, regurgitation. Belching is most bothersome and most frequent.  No dysphagia.  Worse with coffee, EtOH, overeating, spicy, supine.  Has heartburn/regurgitation sxs 2-3 days/week, but belching daily.   Started OTC omeprazole BID with good clinical response. Stopped Rx and sxs returned. Restarted with control, and decreasd to daily, again with good control and eventually weaned to QOD and off, and transition to Pepcid 40 mg QOD in AM.   Endoscopic History: - Colonoscopy many years ago for hemorrhoidal sxs - EGD (03/2020, Dr. Bryan Lemma): LA Grade A esophagitis, 0.5 cm tongue of salmon-colored mucosa (path negative for intestinal metaplasia, Barrett's), Hill grade 3 but no axial height hiatal hernia, fundic gland polyps  HPI:     Patient is a 45 y.o. female presenting to the Gastroenterology Clinic for follow-up.  Initially seen in 03/2020 with subsequent EGD on 04/05/2020 n/f erosive esophagitis and Hill grade 3 valve.  Increased Pepcid to 40 mg bid x6 weeks.  Today, she states reflux improved, but still present. Taking Pepcid bid but still with breakthrough heartburn 2-3 times per week, always in the afternoon.  Has significantly reduced amount of caffeine she is drinking.  No EtOH.  Stopped taking Myrbetriq (prescribed for overactive bladder) due to concern that this could be causing some LES laxity.   Review of systems:     No chest pain, no SOB, no fevers, no urinary sx   Past Medical History:  Diagnosis Date  . Acute bronchitis 01/03/2015  . ADD (attention deficit disorder)   . Anemia   . Anemia, iron deficiency 07/14/2013  . Chicken pox as a child   . Elevated blood pressure reading 08/11/2016  . Fibroid   . GERD (gastroesophageal reflux disease)   . H/O umbilical hernia repair 81/27/5170  . Preventative health care 07/20/2013    Patient's surgical history, family medical history, social history, medications and allergies were all reviewed in Epic    Current Outpatient Medications  Medication Sig Dispense Refill  . famotidine (PEPCID) 40 MG tablet Take 1 tablet (40 mg total) by mouth 2 (two) times daily. 40 mg BID for 6 weeks then daily thereafter. (Patient taking differently: Take 40 mg by mouth 2 (two) times daily.) 90 tablet 3  . levonorgestrel (MIRENA) 20 MCG/24HR IUD 1 each by Intrauterine route once.    . lisdexamfetamine (VYVANSE) 50 MG capsule Take 1 capsule (50 mg total) by mouth daily. April 2022 30 capsule 0  . metoprolol succinate (TOPROL-XL) 25 MG 24 hr tablet Take 1 tablet (25 mg total) by mouth daily. 90 tablet 1  . TYRVAYA 0.03 MG/ACT SOLN Inhale 1 spray into the lungs at bedtime as needed.    . mirabegron ER (MYRBETRIQ) 50 MG TB24 tablet TAKE 1 TABLET(50 MG) BY MOUTH DAILY (Patient not taking: Reported on 05/12/2020) 90 tablet 1   No current facility-administered medications for this visit.    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  Typical symptoms (heartburn, regurgitation) responsive to acid suppression therapy, but still with intermittent breakthrough along with belching.  Has made significant dietary/lifestyle modifications.  We had a long discussion regarding pathophysiology of reflux today, along with treatment options to include ongoing medications or antireflux  surgical options, to include laparoscopic options and TIF.  Plan as below:  - Change Pepcid to Protonix 40 mg bid x4 weeks.  If reflux symptoms better controlled, plan to slowly reduce to lowest effective regimen.  Given predominance of daytime symptoms, would opt to remove pm dose first - Okay to use Pepcid for breakthrough - Has already reduced caffeine intake significantly - Decrease meal portions/avoid overeating - Has already intentionally lost some weight - Depending on response to medical management, may consider TIF.  Provided with handout along with video link today - Biopsies were otherwise negative for intestinal metaplasia, so no BE surveillance protocol needed        Lavena Bullion ,DO, FACG 05/12/2020, 11:39 AM

## 2020-05-13 ENCOUNTER — Encounter: Payer: Self-pay | Admitting: Family Medicine

## 2020-05-13 ENCOUNTER — Ambulatory Visit (INDEPENDENT_AMBULATORY_CARE_PROVIDER_SITE_OTHER): Payer: Managed Care, Other (non HMO) | Admitting: Family Medicine

## 2020-05-13 ENCOUNTER — Other Ambulatory Visit: Payer: Self-pay

## 2020-05-13 VITALS — BP 112/68 | HR 83 | Temp 97.9°F | Resp 16 | Ht 63.0 in | Wt 158.0 lb

## 2020-05-13 DIAGNOSIS — N3281 Overactive bladder: Secondary | ICD-10-CM

## 2020-05-13 DIAGNOSIS — Z1159 Encounter for screening for other viral diseases: Secondary | ICD-10-CM

## 2020-05-13 DIAGNOSIS — E785 Hyperlipidemia, unspecified: Secondary | ICD-10-CM

## 2020-05-13 DIAGNOSIS — Z0000 Encounter for general adult medical examination without abnormal findings: Secondary | ICD-10-CM | POA: Diagnosis not present

## 2020-05-13 DIAGNOSIS — K2100 Gastro-esophageal reflux disease with esophagitis, without bleeding: Secondary | ICD-10-CM

## 2020-05-13 DIAGNOSIS — F988 Other specified behavioral and emotional disorders with onset usually occurring in childhood and adolescence: Secondary | ICD-10-CM | POA: Diagnosis not present

## 2020-05-13 DIAGNOSIS — R739 Hyperglycemia, unspecified: Secondary | ICD-10-CM

## 2020-05-13 MED ORDER — LISDEXAMFETAMINE DIMESYLATE 50 MG PO CAPS: 50.0000 mg | ORAL_CAPSULE | Freq: Every day | ORAL | 0 refills | Status: DC

## 2020-05-13 MED ORDER — METOPROLOL SUCCINATE ER 25 MG PO TB24: 25.0000 mg | ORAL_TABLET | Freq: Every day | ORAL | 1 refills | Status: DC

## 2020-05-13 MED ORDER — MIRABEGRON ER 50 MG PO TB24: ORAL_TABLET | ORAL | 1 refills | Status: DC

## 2020-05-13 NOTE — Assessment & Plan Note (Addendum)
Myrebetriq worked well but is not using it presently, she will let us know if she restarts it

## 2020-05-13 NOTE — Assessment & Plan Note (Signed)
Encouraged heart healthy diet, increase exercise, avoid trans fats, consider a krill oil cap daily 

## 2020-05-13 NOTE — Patient Instructions (Addendum)
Preventive Care 11-44 Years Old, Female Preventive care refers to lifestyle choices and visits with your health care provider that can promote health and wellness. This includes:  A yearly physical exam. This is also called an annual wellness visit.  Regular dental and eye exams.  Immunizations.  Screening for certain conditions.  Healthy lifestyle choices, such as: ? Eating a healthy diet. ? Getting regular exercise. ? Not using drugs or products that contain nicotine and tobacco. ? Limiting alcohol use. What can I expect for my preventive care visit? Physical exam Your health care provider may check your:  Height and weight. These may be used to calculate your BMI (body mass index). BMI is a measurement that tells if you are at a healthy weight.  Heart rate and blood pressure.  Body temperature.  Skin for abnormal spots. Counseling Your health care provider may ask you questions about your:  Past medical problems.  Family's medical history.  Alcohol, tobacco, and drug use.  Emotional well-being.  Home life and relationship well-being.  Sexual activity.  Diet, exercise, and sleep habits.  Work and work Statistician.  Access to firearms.  Method of birth control.  Menstrual cycle.  Pregnancy history. What immunizations do I need? Vaccines are usually given at various ages, according to a schedule. Your health care provider will recommend vaccines for you based on your age, medical history, and lifestyle or other factors, such as travel or where you work.   What tests do I need? Blood tests  Lipid and cholesterol levels. These may be checked every 5 years starting at age 64.  Hepatitis C test.  Hepatitis B test. Screening  Diabetes screening. This is done by checking your blood sugar (glucose) after you have not eaten for a while (fasting).  STD (sexually transmitted disease) testing, if you are at risk.  BRCA-related cancer screening. This may  be done if you have a family history of breast, ovarian, tubal, or peritoneal cancers.  Pelvic exam and Pap test. This may be done every 3 years starting at age 61. Starting at age 82, this may be done every 5 years if you have a Pap test in combination with an HPV test. Talk with your health care provider about your test results, treatment options, and if necessary, the need for more tests.   Follow these instructions at home: Eating and drinking  Eat a healthy diet that includes fresh fruits and vegetables, whole grains, lean protein, and low-fat dairy products.  Take vitamin and mineral supplements as recommended by your health care provider.  Do not drink alcohol if: ? Your health care provider tells you not to drink. ? You are pregnant, may be pregnant, or are planning to become pregnant.  If you drink alcohol: ? Limit how much you have to 0-1 drink a day. ? Be aware of how much alcohol is in your drink. In the [place] one drink equals one 12 oz bottle of beer (355 mL), one 5 oz glass of wine (148 mL), or one 1 oz glass of hard liquor (44 mL).   Lifestyle  Take daily care of your teeth and gums. Brush your teeth every morning and night with fluoride toothpaste. Floss one time each day.  Stay active. Exercise for at least 30 minutes 5 or more days each week.  Do not use any products that contain nicotine or tobacco, such as cigarettes, e-cigarettes, and chewing tobacco. If you need help quitting, ask your health care provider.  Do  not use drugs.  If you are sexually active, practice safe sex. Use a condom or other form of protection to prevent STIs (sexually transmitted infections).  If you do not wish to become pregnant, use a form of birth control. If you plan to become pregnant, see your health care provider for a prepregnancy visit.  Find healthy ways to cope with stress, such as: ? Meditation, yoga, or listening to music. ? Journaling. ? Talking to a trusted  person. ? Spending time with friends and family. Safety  Always wear your seat belt while driving or riding in a vehicle.  Do not drive: ? If you have been drinking alcohol. Do not ride with someone who has been drinking. ? When you are tired or distracted. ? While texting.  Wear a helmet and other protective equipment during sports activities.  If you have firearms in your house, make sure you follow all gun safety procedures.  Seek help if you have been physically or sexually abused. What's next?  Go to your health care provider once a year for an annual wellness visit.  Ask your health care provider how often you should have your eyes and teeth checked.  Stay up to date on all vaccines. This information is not intended to replace advice given to you by your health care provider. Make sure you discuss any questions you have with your health care provider. Document Revised: 08/31/2019 Document Reviewed: 09/13/2017 Elsevier Patient Education  2021 [employer].

## 2020-05-14 LAB — COMPREHENSIVE METABOLIC PANEL
ALT: 17 U/L (ref 0–35)
AST: 18 U/L (ref 0–37)
Albumin: 4.6 g/dL (ref 3.5–5.2)
Alkaline Phosphatase: 47 U/L (ref 39–117)
BUN: 13 mg/dL (ref 6–23)
CO2: 27 mEq/L (ref 19–32)
Calcium: 9.4 mg/dL (ref 8.4–10.5)
Chloride: 103 mEq/L (ref 96–112)
Creatinine, Ser: 1.02 mg/dL (ref 0.40–1.20)
GFR: 67.17 mL/min (ref >60.00)
Glucose, Bld: 85 mg/dL (ref 70–99)
Potassium: 4 mEq/L (ref 3.5–5.1)
Sodium: 138 mEq/L (ref 135–145)
Total Bilirubin: 0.4 mg/dL (ref 0.2–1.2)
Total Protein: 7.5 g/dL (ref 6.0–8.3)

## 2020-05-14 LAB — CBC WITH DIFFERENTIAL/PLATELET
Basophils Absolute: 0 10*3/uL (ref 0.0–0.1)
Basophils Relative: 0.5 % (ref 0.0–3.0)
Eosinophils Absolute: 0.3 10*3/uL (ref 0.0–0.7)
Eosinophils Relative: 3.7 % (ref 0.0–5.0)
HCT: 43 % (ref 36.0–46.0)
Hemoglobin: 14.1 g/dL (ref 12.0–15.0)
Lymphocytes Relative: 21.4 % (ref 12.0–46.0)
Lymphs Abs: 1.7 10*3/uL (ref 0.7–4.0)
MCHC: 32.8 g/dL (ref 30.0–36.0)
MCV: 83.8 fl (ref 78.0–100.0)
Monocytes Absolute: 0.7 10*3/uL (ref 0.1–1.0)
Monocytes Relative: 9.6 % (ref 3.0–12.0)
Neutro Abs: 5 10*3/uL (ref 1.4–7.7)
Neutrophils Relative %: 64.8 % (ref 43.0–77.0)
Platelets: 280 10*3/uL (ref 150.0–400.0)
RBC: 5.13 Mil/uL — ABNORMAL HIGH (ref 3.87–5.11)
RDW: 13 % (ref 11.5–15.5)
WBC: 7.7 10*3/uL (ref 4.0–10.5)

## 2020-05-14 LAB — HEPATITIS C ANTIBODY
Hepatitis C Ab: NONREACTIVE
SIGNAL TO CUT-OFF: 0.01 (ref <1.00)

## 2020-05-14 LAB — HEMOGLOBIN A1C: Hgb A1c MFr Bld: 5.5 % (ref 4.6–6.5)

## 2020-05-14 LAB — LIPID PANEL
Cholesterol: 173 mg/dL (ref 0–200)
HDL: 61.6 mg/dL (ref >39.00)
LDL Cholesterol: 93 mg/dL (ref 0–99)
NonHDL: 111.58
Total CHOL/HDL Ratio: 3
Triglycerides: 94 mg/dL (ref 0.0–149.0)
VLDL: 18.8 mg/dL (ref 0.0–40.0)

## 2020-05-14 LAB — TSH: TSH: 1.82 u[IU]/mL (ref 0.35–4.50)

## 2020-05-15 DIAGNOSIS — R739 Hyperglycemia, unspecified: Secondary | ICD-10-CM | POA: Insufficient documentation

## 2020-05-15 NOTE — Progress Notes (Signed)
Subjective:    Patient ID: Katelyn [name], female    DOB: 12-20-1975, 45 y.o.   MRN: [id number]  Chief Complaint  Patient presents with  . Annual Exam    Pt has no concerns or problems    HPI Patient is in today for annual preventative exam and follow up on ADD, reflux and more. She is doing well today. No recent febrile illness or hospitalizations. She continues to work as a Psychologist, educational in another system. She denies any recent febrile illness or hospitalizations. Her reflux is now being treat by gastroenterology Pepcid 40 mg and PPI daily. It has helped. She has stopped Myrebetriq but it was helping, she may restart it again. Denies CP/palp/SOB/HA/congestion/fevers. Taking meds as prescribed  Past Medical History:  Diagnosis Date  . Acute bronchitis 01/03/2015  . ADD (attention deficit disorder)   . Anemia   . Anemia, iron deficiency 07/14/2013  . Chicken pox as a child  . Elevated blood pressure reading 08/11/2016  . Fibroid   . GERD (gastroesophageal reflux disease)   . H/O umbilical hernia repair 62/95/2841  . Preventative health care 07/20/2013    Past Surgical History:  Procedure Laterality Date  . axillary cyst     left side  . MYOMECTOMY  2008  . WISDOM TOOTH EXTRACTION      Family History  Problem Relation Age of Onset  . Thyroid disease Mother   . Colon polyps Father   . Ulcers Father        back in the 1960's thinks he had part of his stomach removed  . Diabetes Maternal Grandfather        type 2  . Diabetes Paternal Grandmother        type 2  . Heart attack Paternal Grandfather   . Colon cancer Neg Hx   . Esophageal cancer Neg Hx   . Rectal cancer Neg Hx   . Stomach cancer Neg Hx     Social History   Socioeconomic History  . Marital status: Married    Spouse name: Not on file  . Number of children: 2  . Years of education: Not on file  . Highest education level: Not on file  Occupational History  . Occupation: Hospalist   Tobacco Use  . Smoking  status: Never Smoker  . Smokeless tobacco: Never Used  Vaping Use  . Vaping Use: Never used  Substance and Sexual Activity  . Alcohol use: Yes    Comment: ocassionally   . Drug use: No  . Sexual activity: Yes    Birth control/protection: None    Comment: lives with husband and daughter, work at [employer]. No dietary restrictions, MD  Other Topics Concern  . Not on file  Social History Narrative  . Not on file   Social Determinants of Health   Financial Resource Strain: Not on file  Food Insecurity: Not on file  Transportation Needs: Not on file  Physical Activity: Not on file  Stress: Not on file  Social Connections: Not on file  Intimate Partner Violence: Not on file    Outpatient Medications Prior to Visit  Medication Sig Dispense Refill  . famotidine (PEPCID) 40 MG tablet Take 1 tablet (40 mg total) by mouth 2 (two) times daily. 40 mg BID for 6 weeks then daily thereafter. (Patient taking differently: Take 40 mg by mouth 2 (two) times daily.) 90 tablet 3  . levonorgestrel (MIRENA) 20 MCG/24HR IUD 1 each by Intrauterine route once.    . [  START ON 06/11/2020] pantoprazole (PROTONIX) 40 MG tablet TAKE 1 TABLET(40 MG) BY MOUTH TWICE DAILY 90 tablet 3  . TYRVAYA 0.03 MG/ACT SOLN Inhale 1 spray into the lungs at bedtime as needed.    Marland Kitchen lisdexamfetamine (VYVANSE) 50 MG capsule Take 1 capsule (50 mg total) by mouth daily. April 2022 30 capsule 0  . metoprolol succinate (TOPROL-XL) 25 MG 24 hr tablet Take 1 tablet (25 mg total) by mouth daily. 90 tablet 1  . mirabegron ER (MYRBETRIQ) 50 MG TB24 tablet TAKE 1 TABLET(50 MG) BY MOUTH DAILY 90 tablet 1  . [START ON 06/11/2020] pantoprazole (PROTONIX) 40 MG tablet Take 1 tablet (40 mg total) by mouth daily. 90 tablet 3   No facility-administered medications prior to visit.    Allergies  Allergen Reactions  . Adhesive [Tape] Rash    Oxybutynin patch caused blistering rash    Review of Systems  Constitutional: Negative for  chills, fever and malaise/fatigue.  HENT: Negative for congestion and hearing loss.   Eyes: Negative for discharge.  Respiratory: Negative for cough, sputum production and shortness of breath.   Cardiovascular: Negative for chest pain, palpitations and leg swelling.  Gastrointestinal: Positive for heartburn. Negative for abdominal pain, blood in stool, constipation, diarrhea, nausea and vomiting.  Genitourinary: Positive for frequency and urgency. Negative for dysuria and hematuria.  Musculoskeletal: Negative for back pain, falls and myalgias.  Skin: Negative for rash.  Neurological: Negative for dizziness, sensory change, loss of consciousness, weakness and headaches.  Endo/Heme/Allergies: Negative for environmental allergies. Does not bruise/bleed easily.  Psychiatric/Behavioral: Negative for depression and suicidal ideas. The patient is nervous/anxious. The patient does not have insomnia.        Objective:    Physical Exam Constitutional:      General: She is not in acute distress.    Appearance: She is not diaphoretic.  HENT:     Head: Normocephalic and atraumatic.     Right Ear: External ear normal.     Left Ear: External ear normal.     Nose: Nose normal.     Mouth/Throat:     Pharynx: No oropharyngeal exudate.  Eyes:     General: No scleral icterus.       Right eye: No discharge.        Left eye: No discharge.     Conjunctiva/sclera: Conjunctivae normal.     Pupils: Pupils are equal, round, and reactive to light.  Neck:     Thyroid: No thyromegaly.  Cardiovascular:     Rate and Rhythm: Normal rate and regular rhythm.     Heart sounds: Normal heart sounds. No murmur heard.   Pulmonary:     Effort: Pulmonary effort is normal. No respiratory distress.     Breath sounds: Normal breath sounds. No wheezing or rales.  Abdominal:     General: Bowel sounds are normal. There is no distension.     Palpations: Abdomen is soft. There is no mass.     Tenderness: There is no  abdominal tenderness.  Musculoskeletal:        General: No tenderness. Normal range of motion.     Cervical back: Normal range of motion and neck supple.  Lymphadenopathy:     Cervical: No cervical adenopathy.  Skin:    General: Skin is warm and dry.     Findings: No rash.  Neurological:     Mental Status: She is alert and oriented to person, place, and time.     Cranial Nerves: No  cranial nerve deficit.     Coordination: Coordination normal.     Deep Tendon Reflexes: Reflexes are normal and symmetric. Reflexes normal.     BP 112/68   Pulse 83   Temp 97.9 F (36.6 C)   Resp 16   Ht 5\' 3"  (1.6 m)   Wt 158 lb (71.7 kg)   SpO2 99%   BMI 27.99 kg/m  Wt Readings from Last 3 Encounters:  05/13/20 158 lb (71.7 kg)  05/12/20 160 lb (72.6 kg)  04/05/20 163 lb (73.9 kg)    Diabetic Foot Exam - Simple   No data filed    Lab Results  Component Value Date   WBC 7.7 05/13/2020   HGB 14.1 05/13/2020   HCT 43.0 05/13/2020   PLT 280.0 05/13/2020   GLUCOSE 85 05/13/2020   CHOL 173 05/13/2020   TRIG 94.0 05/13/2020   HDL 61.60 05/13/2020   LDLCALC 93 05/13/2020   ALT 17 05/13/2020   AST 18 05/13/2020   NA 138 05/13/2020   K 4.0 05/13/2020   CL 103 05/13/2020   CREATININE 1.02 05/13/2020   BUN 13 05/13/2020   CO2 27 05/13/2020   TSH 1.82 05/13/2020   HGBA1C 5.5 05/13/2020    Lab Results  Component Value Date   TSH 1.82 05/13/2020   Lab Results  Component Value Date   WBC 7.7 05/13/2020   HGB 14.1 05/13/2020   HCT 43.0 05/13/2020   MCV 83.8 05/13/2020   PLT 280.0 05/13/2020   Lab Results  Component Value Date   NA 138 05/13/2020   K 4.0 05/13/2020   CO2 27 05/13/2020   GLUCOSE 85 05/13/2020   BUN 13 05/13/2020   CREATININE 1.02 05/13/2020   BILITOT 0.4 05/13/2020   ALKPHOS 47 05/13/2020   AST 18 05/13/2020   ALT 17 05/13/2020   PROT 7.5 05/13/2020   ALBUMIN 4.6 05/13/2020   CALCIUM 9.4 05/13/2020   GFR 67.17 05/13/2020   Lab Results  Component  Value Date   CHOL 173 05/13/2020   Lab Results  Component Value Date   HDL 61.60 05/13/2020   Lab Results  Component Value Date   LDLCALC 93 05/13/2020   Lab Results  Component Value Date   TRIG 94.0 05/13/2020   Lab Results  Component Value Date   CHOLHDL 3 05/13/2020   Lab Results  Component Value Date   HGBA1C 5.5 05/13/2020       Assessment & Plan:   Problem List Items Addressed This Visit    Preventative health care    Patient encouraged to maintain heart healthy diet, regular exercise, adequate sleep. Consider daily probiotics. Take medications as prescribed. Follows with [facility] she will follow up with them for pap and mgm. Colonoscopy at age 46. Labs ordered and reviewed      Relevant Orders   Hemoglobin A1c (Completed)   CBC with Differential/Platelet (Completed)   TSH (Completed)   Comprehensive metabolic panel (Completed)   Lipid panel (Completed)   ADD (attention deficit disorder)    Doing well on current meds, no changes to therapy refills given today      Relevant Medications   lisdexamfetamine (VYVANSE) 50 MG capsule   Hyperlipidemia - Primary    Encouraged heart healthy diet, increase exercise, avoid trans fats, consider a krill oil cap daily      Relevant Medications   metoprolol succinate (TOPROL-XL) 25 MG 24 hr tablet   Other Relevant Orders   CBC with Differential/Platelet (Completed)  Comprehensive metabolic panel (Completed)   Lipid panel (Completed)   OAB (overactive bladder)    Myrebetriq worked well but is not using it presently, she will let us know if she restarts it      Gastroesophageal reflux disease    Avoid offending foods, start probiotics. Do not eat large meals in late evening and consider raising head of bed. Using Famotidine 40 mg bid and a PPI daily      Hyperglycemia    hgba1c acceptable, minimize simple carbs. Increase exercise as tolerated.        Other Visit Diagnoses    Encounter for hepatitis  C screening test for low risk patient       Relevant Orders   Hepatitis C Antibody (Completed)      I have changed Georgina Peer Biskup's lisdexamfetamine. I am also having her start on lisdexamfetamine and lisdexamfetamine. Additionally, I am having her maintain her levonorgestrel, Tyrvaya, famotidine, pantoprazole, metoprolol succinate, and mirabegron ER.  Meds ordered this encounter  Medications  . metoprolol succinate (TOPROL-XL) 25 MG 24 hr tablet    Sig: Take 1 tablet (25 mg total) by mouth daily.    Dispense:  90 tablet    Refill:  1  . lisdexamfetamine (VYVANSE) 50 MG capsule    Sig: Take 1 capsule (50 mg total) by mouth daily. May 2022    Dispense:  30 capsule    Refill:  0  . mirabegron ER (MYRBETRIQ) 50 MG TB24 tablet    Sig: TAKE 1 TABLET(50 MG) BY MOUTH DAILY    Dispense:  90 tablet    Refill:  1    ZERO refills remain on this prescription. Your patient is requesting advance approval of refills for this medication to [facility]  . lisdexamfetamine (VYVANSE) 50 MG capsule    Sig: Take 1 capsule (50 mg total) by mouth daily. June 2022    Dispense:  30 capsule    Refill:  0  . lisdexamfetamine (VYVANSE) 50 MG capsule    Sig: Take 1 capsule (50 mg total) by mouth daily. July 2022    Dispense:  30 capsule    Refill:  0     Penni Homans, MD

## 2020-05-15 NOTE — Assessment & Plan Note (Signed)
Patient encouraged to maintain heart healthy diet, regular exercise, adequate sleep. Consider daily probiotics. Take medications as prescribed. Follows with [facility] she will follow up with them for pap and mgm. Colonoscopy at age 45. Labs ordered and reviewed

## 2020-05-15 NOTE — Assessment & Plan Note (Signed)
hgba1c acceptable, minimize simple carbs. Increase exercise as tolerated.  

## 2020-05-15 NOTE — Assessment & Plan Note (Signed)
Avoid offending foods, start probiotics. Do not eat large meals in late evening and consider raising head of bed. Using Famotidine 40 mg bid and a PPI daily

## 2020-05-15 NOTE — Assessment & Plan Note (Signed)
Doing well on current meds, no changes to therapy refills given today

## 2020-05-17 ENCOUNTER — Encounter: Payer: Self-pay | Admitting: *Deleted

## 2020-06-16 ENCOUNTER — Other Ambulatory Visit: Payer: Self-pay | Admitting: Family Medicine

## 2020-08-31 ENCOUNTER — Telehealth: Payer: Self-pay | Admitting: Family Medicine

## 2020-08-31 ENCOUNTER — Other Ambulatory Visit: Payer: Self-pay | Admitting: Family Medicine

## 2020-08-31 DIAGNOSIS — F988 Other specified behavioral and emotional disorders with onset usually occurring in childhood and adolescence: Secondary | ICD-10-CM

## 2020-08-31 MED ORDER — LISDEXAMFETAMINE DIMESYLATE 50 MG PO CAPS: 50.0000 mg | ORAL_CAPSULE | Freq: Every day | ORAL | 0 refills | Status: DC

## 2020-08-31 NOTE — Telephone Encounter (Signed)
Medication: lisdexamfetamine (VYVANSE) 50 MG capsule   Has the patient contacted their pharmacy? Yes.   (If no, request that the patient contact the pharmacy for the refill.) (If yes, when and what did the pharmacy advise?) no more prescriptions left when she is supposed to have refills until october  Preferred Pharmacy (with phone number or street name): [id number] - [place], [place] - [address]  [place], [place] [address]  Phone:  [phone]    Agent: Please be advised that RX refills may take up to 3 business days. We ask that you follow-up with your pharmacy.

## 2020-09-01 NOTE — Telephone Encounter (Signed)
Patient notified that rxs have been sent in. 

## 2020-11-01 LAB — HM MAMMOGRAPHY

## 2020-11-15 ENCOUNTER — Other Ambulatory Visit: Payer: Self-pay

## 2020-11-15 ENCOUNTER — Ambulatory Visit (INDEPENDENT_AMBULATORY_CARE_PROVIDER_SITE_OTHER): Payer: Managed Care, Other (non HMO) | Admitting: Family Medicine

## 2020-11-15 VITALS — BP 125/82 | HR 68 | Temp 97.7°F | Resp 12 | Ht 63.0 in | Wt 159.6 lb

## 2020-11-15 DIAGNOSIS — R739 Hyperglycemia, unspecified: Secondary | ICD-10-CM | POA: Diagnosis not present

## 2020-11-15 DIAGNOSIS — Z79899 Other long term (current) drug therapy: Secondary | ICD-10-CM

## 2020-11-15 DIAGNOSIS — E785 Hyperlipidemia, unspecified: Secondary | ICD-10-CM | POA: Diagnosis not present

## 2020-11-15 DIAGNOSIS — F988 Other specified behavioral and emotional disorders with onset usually occurring in childhood and adolescence: Secondary | ICD-10-CM | POA: Diagnosis not present

## 2020-11-15 DIAGNOSIS — R000 Tachycardia, unspecified: Secondary | ICD-10-CM

## 2020-11-15 MED ORDER — LISDEXAMFETAMINE DIMESYLATE 50 MG PO CAPS: 50.0000 mg | ORAL_CAPSULE | Freq: Every day | ORAL | 0 refills | Status: DC

## 2020-11-15 MED ORDER — METOPROLOL SUCCINATE ER 25 MG PO TB24: ORAL_TABLET | ORAL | 3 refills | Status: DC

## 2020-11-15 NOTE — Assessment & Plan Note (Signed)
Encourage heart healthy diet such as MIND or DASH diet, increase exercise, avoid trans fats, simple carbohydrates and processed foods, consider a krill or fish or flaxseed oil cap daily.  °

## 2020-11-15 NOTE — Assessment & Plan Note (Signed)
hgba1c acceptable, minimize simple carbs. Increase exercise as tolerated.  

## 2020-11-15 NOTE — Progress Notes (Signed)
Subjective:   By signing my name below, I, Zite Okoli, attest that this documentation has been prepared under the direction and in the presence of [name], MD. 11/15/2020    Patient ID: Katelyn [name], female    DOB: 11-10-1975, 45 y.o.   MRN: [id number]  Chief Complaint  Patient presents with   Follow-up    HPI Patient is in today for an office visit and 6 month f/u.  She is requesting a refill for 50 mg vyvanse 25 mg metoprolol. She will also undergo a urine drug screening test.  She has 3 Covid-19 vaccines at this time and is interested in getting the new booster vaccine.  Past Medical History:  Diagnosis Date   Acute bronchitis 01/03/2015   ADD (attention deficit disorder)    Anemia    Anemia, iron deficiency 07/14/2013   Chicken pox as a child   Elevated blood pressure reading 08/11/2016   Fibroid    GERD (gastroesophageal reflux disease)    H/O umbilical hernia repair 74/25/9563   Preventative health care 07/20/2013    Past Surgical History:  Procedure Laterality Date   axillary cyst     left side   MYOMECTOMY  2008   WISDOM TOOTH EXTRACTION      Family History  Problem Relation Age of Onset   Thyroid disease Mother    Colon polyps Father    Ulcers Father        back in the 1960's thinks he had part of his stomach removed   Diabetes Maternal Grandfather        type 2   Diabetes Paternal Grandmother        type 2   Heart attack Paternal Grandfather    Colon cancer Neg Hx    Esophageal cancer Neg Hx    Rectal cancer Neg Hx    Stomach cancer Neg Hx     Social History   Socioeconomic History   Marital status: Married    Spouse name: Not on file   Number of children: 2   Years of education: Not on file   Highest education level: Not on file  Occupational History   Occupation: Hospalist   Tobacco Use   Smoking status: Never   Smokeless tobacco: Never  Vaping Use   Vaping Use: Never used  Substance and Sexual Activity   Alcohol use: Yes     Comment: ocassionally    Drug use: No   Sexual activity: Yes    Birth control/protection: None    Comment: lives with husband and daughter, work at [employer]. No dietary restrictions, MD  Other Topics Concern   Not on file  Social History Narrative   Not on file   Social Determinants of Health   Financial Resource Strain: Not on file  Food Insecurity: Not on file  Transportation Needs: Not on file  Physical Activity: Not on file  Stress: Not on file  Social Connections: Not on file  Intimate Partner Violence: Not on file    Outpatient Medications Prior to Visit  Medication Sig Dispense Refill   famotidine (PEPCID) 40 MG tablet Take 1 tablet (40 mg total) by mouth 2 (two) times daily. 40 mg BID for 6 weeks then daily thereafter. (Patient taking differently: Take 40 mg by mouth 2 (two) times daily.) 90 tablet 3   levonorgestrel (MIRENA) 20 MCG/24HR IUD 1 each by Intrauterine route once.     mirabegron ER (MYRBETRIQ) 50 MG TB24 tablet TAKE 1 TABLET(50  MG) BY MOUTH DAILY 90 tablet 1   pantoprazole (PROTONIX) 40 MG tablet TAKE 1 TABLET(40 MG) BY MOUTH TWICE DAILY 90 tablet 3   TYRVAYA 0.03 MG/ACT SOLN Inhale 1 spray into the lungs at bedtime as needed.     lisdexamfetamine (VYVANSE) 50 MG capsule Take 1 capsule (50 mg total) by mouth daily. August 2022 30 capsule 0   lisdexamfetamine (VYVANSE) 50 MG capsule Take 1 capsule (50 mg total) by mouth daily. October 2022 30 capsule 0   lisdexamfetamine (VYVANSE) 50 MG capsule Take 1 capsule (50 mg total) by mouth daily. September 2022 30 capsule 0   metoprolol succinate (TOPROL-XL) 25 MG 24 hr tablet TAKE 1 TABLET(25 MG) BY MOUTH DAILY 90 tablet 1   No facility-administered medications prior to visit.    Allergies  Allergen Reactions   Adhesive [Tape] Rash    Oxybutynin patch caused blistering rash    Review of Systems  Constitutional:  Negative for fever and malaise/fatigue.  HENT:  Negative for congestion.   Eyes:  Negative  for redness.  Respiratory:  Negative for shortness of breath.   Cardiovascular:  Negative for chest pain, palpitations and leg swelling.  Gastrointestinal:  Negative for abdominal pain, blood in stool and nausea.  Genitourinary:  Negative for dysuria and frequency.  Musculoskeletal:  Negative for falls.  Skin:  Negative for rash.  Neurological:  Negative for dizziness, loss of consciousness and headaches.  Endo/Heme/Allergies:  Negative for polydipsia.  Psychiatric/Behavioral:  Negative for depression. The patient is not nervous/anxious.       Objective:    Physical Exam Constitutional:      General: She is not in acute distress.    Appearance: She is well-developed.  HENT:     Head: Normocephalic and atraumatic.  Eyes:     Conjunctiva/sclera: Conjunctivae normal.  Neck:     Thyroid: No thyromegaly.  Cardiovascular:     Rate and Rhythm: Normal rate and regular rhythm.     Heart sounds: Normal heart sounds. No murmur heard. Pulmonary:     Effort: Pulmonary effort is normal. No respiratory distress.     Breath sounds: Normal breath sounds.  Abdominal:     General: Bowel sounds are normal. There is no distension.     Palpations: Abdomen is soft. There is no mass.     Tenderness: There is no abdominal tenderness.  Musculoskeletal:     Cervical back: Neck supple.  Lymphadenopathy:     Cervical: No cervical adenopathy.  Skin:    General: Skin is warm and dry.  Neurological:     Mental Status: She is alert and oriented to person, place, and time.  Psychiatric:        Behavior: Behavior normal.    BP 125/82 (BP Location: Right Arm, Cuff Size: Normal)   Pulse 68   Temp 97.7 F (36.5 C) (Oral)   Resp 12   Ht 5\' 3"  (1.6 m)   Wt 159 lb 9.6 oz (72.4 kg)   SpO2 100%   BMI 28.27 kg/m  Wt Readings from Last 3 Encounters:  11/15/20 159 lb 9.6 oz (72.4 kg)  05/13/20 158 lb (71.7 kg)  05/12/20 160 lb (72.6 kg)    Diabetic Foot Exam - Simple   No data filed    Lab  Results  Component Value Date   WBC 7.7 05/13/2020   HGB 14.1 05/13/2020   HCT 43.0 05/13/2020   PLT 280.0 05/13/2020   GLUCOSE 85 05/13/2020   CHOL 173  05/13/2020   TRIG 94.0 05/13/2020   HDL 61.60 05/13/2020   LDLCALC 93 05/13/2020   ALT 17 05/13/2020   AST 18 05/13/2020   NA 138 05/13/2020   K 4.0 05/13/2020   CL 103 05/13/2020   CREATININE 1.02 05/13/2020   BUN 13 05/13/2020   CO2 27 05/13/2020   TSH 1.82 05/13/2020   HGBA1C 5.5 05/13/2020    Lab Results  Component Value Date   TSH 1.82 05/13/2020   Lab Results  Component Value Date   WBC 7.7 05/13/2020   HGB 14.1 05/13/2020   HCT 43.0 05/13/2020   MCV 83.8 05/13/2020   PLT 280.0 05/13/2020   Lab Results  Component Value Date   NA 138 05/13/2020   K 4.0 05/13/2020   CO2 27 05/13/2020   GLUCOSE 85 05/13/2020   BUN 13 05/13/2020   CREATININE 1.02 05/13/2020   BILITOT 0.4 05/13/2020   ALKPHOS 47 05/13/2020   AST 18 05/13/2020   ALT 17 05/13/2020   PROT 7.5 05/13/2020   ALBUMIN 4.6 05/13/2020   CALCIUM 9.4 05/13/2020   GFR 67.17 05/13/2020   Lab Results  Component Value Date   CHOL 173 05/13/2020   Lab Results  Component Value Date   HDL 61.60 05/13/2020   Lab Results  Component Value Date   LDLCALC 93 05/13/2020   Lab Results  Component Value Date   TRIG 94.0 05/13/2020   Lab Results  Component Value Date   CHOLHDL 3 05/13/2020   Lab Results  Component Value Date   HGBA1C 5.5 05/13/2020       Assessment & Plan:   Problem List Items Addressed This Visit     ADD (attention deficit disorder)    Vyvanse is refilled today. It is working well. UDS updated      Relevant Medications   lisdexamfetamine (VYVANSE) 50 MG capsule   Other Relevant Orders   Drug Monitoring Panel [phone] , Urine   Hyperlipidemia    Encourage heart healthy diet such as MIND or DASH diet, increase exercise, avoid trans fats, simple carbohydrates and processed foods, consider a krill or fish or flaxseed oil cap  daily.       Relevant Medications   metoprolol succinate (TOPROL-XL) 25 MG 24 hr tablet   Tachycardia    RRR today on current meds      Hyperglycemia    hgba1c acceptable, minimize simple carbs. Increase exercise as tolerated.       Other Visit Diagnoses     High risk medication use    -  Primary   Relevant Orders   Drug Monitoring Panel [phone] , Urine       Meds ordered this encounter  Medications   lisdexamfetamine (VYVANSE) 50 MG capsule    Sig: Take 1 capsule (50 mg total) by mouth daily. January 2023    Dispense:  30 capsule    Refill:  0   lisdexamfetamine (VYVANSE) 50 MG capsule    Sig: Take 1 capsule (50 mg total) by mouth daily. December 2022    Dispense:  30 capsule    Refill:  0   lisdexamfetamine (VYVANSE) 50 MG capsule    Sig: Take 1 capsule (50 mg total) by mouth daily. November 2022    Dispense:  30 capsule    Refill:  0   metoprolol succinate (TOPROL-XL) 25 MG 24 hr tablet    Sig: TAKE 1 TABLET(25 MG) BY MOUTH DAILY    Dispense:  90 tablet  Refill:  3    I,Zite Okoli,acting as a scribe for Penni Homans, MD.,have documented all relevant documentation on the behalf of Penni Homans, MD,as directed by  Penni Homans, MD while in the presence of Penni Homans, MD.   I, [name], MD., personally preformed the services described in this documentation.  All medical record entries made by the scribe were at my direction and in my presence.  I have reviewed the chart and discharge instructions (if applicable) and agree that the record reflects my personal performance and is accurate and complete. 11/15/2020

## 2020-11-15 NOTE — Addendum Note (Signed)
Addended by: Kem Boroughs D on: 11/15/2020 01:04 PM   Modules accepted: Orders

## 2020-11-15 NOTE — Assessment & Plan Note (Signed)
RRR today on current meds

## 2020-11-15 NOTE — Assessment & Plan Note (Signed)
Vyvanse is refilled today. It is working well. UDS updated

## 2020-11-18 LAB — DRUG MONITORING PANEL 376104, URINE
Amphetamine: 320 ng/mL — ABNORMAL HIGH (ref <250)
Amphetamines: POSITIVE ng/mL — AB (ref <500)
Barbiturates: NEGATIVE ng/mL (ref <300)
Benzodiazepines: NEGATIVE ng/mL (ref <100)
Cocaine Metabolite: NEGATIVE ng/mL (ref <150)
Desmethyltramadol: NEGATIVE ng/mL (ref <100)
Methamphetamine: NEGATIVE ng/mL (ref <250)
Opiates: NEGATIVE ng/mL (ref <100)
Oxycodone: NEGATIVE ng/mL (ref <100)
Tramadol: NEGATIVE ng/mL (ref <100)

## 2020-11-18 LAB — DM TEMPLATE

## 2021-01-26 ENCOUNTER — Other Ambulatory Visit: Payer: Self-pay

## 2021-01-26 ENCOUNTER — Telehealth: Payer: Self-pay | Admitting: Family Medicine

## 2021-01-26 MED ORDER — METOPROLOL SUCCINATE ER 25 MG PO TB24: ORAL_TABLET | ORAL | 3 refills | Status: DC

## 2021-01-26 NOTE — Telephone Encounter (Signed)
Medication:  metoprolol succinate (TOPROL-XL) 25 MG 24 hr tablet [[id number]]  lisdexamfetamine (VYVANSE) 50 MG capsule [[id number]  Has the patient contacted their pharmacy? No. (If no, request that the patient contact the pharmacy for the refill.) (If yes, when and what did the pharmacy advise?)     Preferred Pharmacy (with phone number or street name):  [facility] #[id number] - [place], [address]  [place], [place] [address]  Phone:  [phone]  Fax:  [phone]     Agent: Please be advised that RX refills may take up to 3 business days. We ask that you follow-up with your pharmacy.

## 2021-01-26 NOTE — Telephone Encounter (Signed)
Sent in toprol but she had a refill on Vyvanse for January in the system already

## 2021-01-28 ENCOUNTER — Other Ambulatory Visit: Payer: Self-pay | Admitting: Family Medicine

## 2021-02-26 ENCOUNTER — Other Ambulatory Visit: Payer: Self-pay | Admitting: Family Medicine

## 2021-02-26 DIAGNOSIS — F988 Other specified behavioral and emotional disorders with onset usually occurring in childhood and adolescence: Secondary | ICD-10-CM

## 2021-02-28 MED ORDER — LISDEXAMFETAMINE DIMESYLATE 50 MG PO CAPS: 50.0000 mg | ORAL_CAPSULE | Freq: Every day | ORAL | 0 refills | Status: DC

## 2021-02-28 NOTE — Telephone Encounter (Signed)
Pt called again regarding med refill.   Please advise.

## 2021-02-28 NOTE — Telephone Encounter (Signed)
Requesting: Vyvanse 50mg   Contract: 11/15/2020 UDS: 11/15/2020 Last Visit: 11/15/2020 Next Visit: 05/16/2021 Last Refill: 11/15/2020 #30 and 0RF (x3)  Please Advise

## 2021-03-25 ENCOUNTER — Other Ambulatory Visit: Payer: Self-pay | Admitting: Family Medicine

## 2021-03-25 DIAGNOSIS — F988 Other specified behavioral and emotional disorders with onset usually occurring in childhood and adolescence: Secondary | ICD-10-CM

## 2021-03-25 MED ORDER — LISDEXAMFETAMINE DIMESYLATE 50 MG PO CAPS: 50.0000 mg | ORAL_CAPSULE | Freq: Every day | ORAL | 0 refills | Status: DC

## 2021-03-25 NOTE — Telephone Encounter (Signed)
Requesting: Vyvanse '50MG'$   ?Contract: 11/15/20 ?UDS: 11/15/2020 ?Last Visit: 11/15/2020 ?Next Visit: 05/16/2021 ?Last Refill: 02/28/21, #30, 0 refills ? ?Please Advise  ?

## 2021-03-25 NOTE — Telephone Encounter (Signed)
Medication: lisdexamfetamine (VYVANSE) 50 MG capsule  ? ?Has the patient contacted their pharmacy? Yes.   ? ? ?Preferred Pharmacy: [facility] #[id number] - [place], [place] [address]  ?[place], [place] [address]  ?Phone:  [phone]  Fax:  [phone] ? ?

## 2021-04-27 ENCOUNTER — Other Ambulatory Visit: Payer: Self-pay | Admitting: Family Medicine

## 2021-04-27 DIAGNOSIS — F988 Other specified behavioral and emotional disorders with onset usually occurring in childhood and adolescence: Secondary | ICD-10-CM

## 2021-04-27 NOTE — Telephone Encounter (Signed)
Medication: lisdexamfetamine (VYVANSE) 50 MG capsule  ? ?Has the patient contacted their pharmacy? Yes.   ? ? ?Preferred Pharmacy: [facility] #[id number] - [place], [place] [address]  ?[place], [place] [address]  ?Phone:  [phone]  Fax:  [phone]  ? ?

## 2021-04-28 MED ORDER — LISDEXAMFETAMINE DIMESYLATE 50 MG PO CAPS: 50.0000 mg | ORAL_CAPSULE | Freq: Every day | ORAL | 0 refills | Status: DC

## 2021-04-28 NOTE — Telephone Encounter (Signed)
Requesting: Vyvanse '50MG'$  ?Contract: 11/15/20 ?UDS: 11/15/20 ?Last Visit: 11/15/20 ?Next Visit: 05/16/21 ?Last Refill: 03/25/21  ? ?Please Advise  ?

## 2021-05-16 ENCOUNTER — Encounter: Payer: Managed Care, Other (non HMO) | Admitting: Family Medicine

## 2021-05-23 ENCOUNTER — Ambulatory Visit (INDEPENDENT_AMBULATORY_CARE_PROVIDER_SITE_OTHER): Payer: Managed Care, Other (non HMO) | Admitting: Family

## 2021-05-23 ENCOUNTER — Encounter: Payer: Self-pay | Admitting: Family

## 2021-05-23 VITALS — BP 118/79 | HR 90 | Temp 99.1°F | Resp 16 | Ht 63.0 in | Wt 150.6 lb

## 2021-05-23 DIAGNOSIS — R5383 Other fatigue: Secondary | ICD-10-CM | POA: Diagnosis not present

## 2021-05-23 DIAGNOSIS — L709 Acne, unspecified: Secondary | ICD-10-CM | POA: Insufficient documentation

## 2021-05-23 DIAGNOSIS — Z8349 Family history of other endocrine, nutritional and metabolic diseases: Secondary | ICD-10-CM | POA: Diagnosis not present

## 2021-05-23 DIAGNOSIS — Z0000 Encounter for general adult medical examination without abnormal findings: Secondary | ICD-10-CM

## 2021-05-23 DIAGNOSIS — R232 Flushing: Secondary | ICD-10-CM | POA: Diagnosis not present

## 2021-05-23 DIAGNOSIS — Z0001 Encounter for general adult medical examination with abnormal findings: Secondary | ICD-10-CM

## 2021-05-23 DIAGNOSIS — Z1211 Encounter for screening for malignant neoplasm of colon: Secondary | ICD-10-CM

## 2021-05-23 LAB — T3, FREE: T3, Free: 2.9 pg/mL (ref 2.3–4.2)

## 2021-05-23 LAB — T4, FREE: Free T4: 0.75 ng/dL (ref 0.60–1.60)

## 2021-05-23 LAB — LUTEINIZING HORMONE: LH: 9.22 m[IU]/mL

## 2021-05-23 LAB — TSH: TSH: 1.63 u[IU]/mL (ref 0.35–5.50)

## 2021-05-23 LAB — FOLLICLE STIMULATING HORMONE: FSH: 9.3 m[IU]/mL

## 2021-05-23 MED ORDER — BENZOYL PEROXIDE-ERYTHROMYCIN 5-3 % EX GEL: Freq: Two times a day (BID) | CUTANEOUS | 5 refills | Status: DC

## 2021-05-23 NOTE — Patient Instructions (Signed)
Please complete lab work prior to leaving. ?You should be contacted about your referral to GI for colonoscopy.  ?

## 2021-05-23 NOTE — Assessment & Plan Note (Signed)
Continue healthy diet and regular exercise. She is due for colonoscopy- referral placed.  Mammo and pap smear up to date.  ?

## 2021-05-23 NOTE — Assessment & Plan Note (Signed)
Uncontrolled. Will rx with benzamycin gel.  ?

## 2021-05-23 NOTE — Progress Notes (Signed)
? ?Subjective:  ? ?By signing my name below, I, Katelyn [name], attest that this documentation has been prepared under the direction and in the presence of Debbrah Alar, NP 05/23/2021  ? ? ? Patient ID: Katelyn [name], female    DOB: [birth year], 46 y.o.   MRN: [id number] ? ?No chief complaint on file. ? ? ?HPI ?Patient is in today for a comprehensive physical exam. ? ?Hormonal changes- She reports her eyes have been feeling dry. She has had recent acne flare-ups and her hair has turned curly. Her hair is straight normally. She has amenorrhea so she cannot tell if her periods are regular. She had one episode of hot flashes in December 2022.  Adds she is always fatigued ?Mammogram-  Last checked on 11/01/2020. Results were normal. Repeat in 1 year. ?Pap smear- Last checked on 08/13/2019. Results were normal. Repeat in 3 years. Reports she had a more recent pap smear. ?Diet and Exercise- She is trying to manage a healthy diet. She is trying to exercise more since she is part-time this year. She reports she has lost about 10 pounds. ?Wt Readings from Last 3 Encounters:  ?05/23/21 150 lb 9.6 oz (68.3 kg)  ?11/15/20 159 lb 9.6 oz (72.4 kg)  ?05/13/20 158 lb (71.7 kg)  ?  ?Dental and vision- She is UTD on vision and dental ?Social History- She occasionally drinks alcohol. She does not use drugs or smoke tobacco. ? ?No recent surgeries. No changes to family medical history.  ? ?She denies having any unexpected weight change, ear pain, hearing loss and rhinorrhea, visual disturbance, cough, chest pain and leg swelling, nausea, vomiting, diarrhea and blood in stool, or dysuria and frequency, for myalgias and arthralgias, rash, headaches, adenopathy, depression or anxiety at this time  ? ?Past Medical History:  ?Diagnosis Date  ? Acute bronchitis 01/03/2015  ? ADD (attention deficit disorder)   ? Anemia   ? Anemia, iron deficiency 07/14/2013  ? Chicken pox as a child  ? Elevated blood pressure reading 08/11/2016  ? Fibroid   ?  GERD (gastroesophageal reflux disease)   ? H/O umbilical hernia repair 53/97/6734  ? Preventative health care 07/20/2013  ? ? ?Past Surgical History:  ?Procedure Laterality Date  ? axillary cyst    ? left side  ? MYOMECTOMY  2008  ? WISDOM TOOTH EXTRACTION    ? ? ?Family History  ?Problem Relation Age of Onset  ? Thyroid disease Mother   ? Colon polyps Father   ? Ulcers Father   ?     back in the 1960's thinks he had part of his stomach removed  ? Diabetes Maternal Grandfather   ?     type 2  ? Diabetes Paternal Grandmother   ?     type 2  ? Heart attack Paternal Grandfather   ? Colon cancer Neg Hx   ? Esophageal cancer Neg Hx   ? Rectal cancer Neg Hx   ? Stomach cancer Neg Hx   ? ? ?Social History  ? ?Socioeconomic History  ? Marital status: Married  ?  Spouse name: Not on file  ? Number of children: 2  ? Years of education: Not on file  ? Highest education level: Not on file  ?Occupational History  ? Occupation: Hospalist   ?Tobacco Use  ? Smoking status: Never  ? Smokeless tobacco: Never  ?Vaping Use  ? Vaping Use: Never used  ?Substance and Sexual Activity  ? Alcohol use: Yes  ?  Comment:  ocassionally   ? Drug use: No  ? Sexual activity: Yes  ?  Birth control/protection: None  ?  Comment: lives with husband and daughter, work at [employer]. No dietary restrictions, MD  ?Other Topics Concern  ? Not on file  ?Social History Narrative  ? Works as a Psychologist, educational at [employer]  ? ?Social Determinants of Health  ? ?Financial Resource Strain: Not on file  ?Food Insecurity: Not on file  ?Transportation Needs: Not on file  ?Physical Activity: Not on file  ?Stress: Not on file  ?Social Connections: Not on file  ?Intimate Partner Violence: Not on file  ? ? ?Outpatient Medications Prior to Visit  ?Medication Sig Dispense Refill  ? famotidine (PEPCID) 40 MG tablet TAKE 1 TABLET(40 MG) BY MOUTH AT BEDTIME AS NEEDED FOR HEARTBURN OR INDIGESTION 90 tablet 1  ? levonorgestrel (MIRENA) 20 MCG/24HR IUD 1 each by Intrauterine route  once.    ? lisdexamfetamine (VYVANSE) 50 MG capsule Take 1 capsule (50 mg total) by mouth daily. January 2023 30 capsule 0  ? metoprolol succinate (TOPROL-XL) 25 MG 24 hr tablet TAKE 1 TABLET(25 MG) BY MOUTH DAILY 90 tablet 3  ? pantoprazole (PROTONIX) 40 MG tablet TAKE 1 TABLET(40 MG) BY MOUTH TWICE DAILY 90 tablet 3  ? TYRVAYA 0.03 MG/ACT SOLN Inhale 1 spray into the lungs at bedtime as needed.    ? XIIDRA 5 % SOLN Apply 1 drop to eye 2 (two) times daily.    ? mirabegron ER (MYRBETRIQ) 50 MG TB24 tablet TAKE 1 TABLET(50 MG) BY MOUTH DAILY 90 tablet 1  ? ?No facility-administered medications prior to visit.  ? ? ?Allergies  ?Allergen Reactions  ? Adhesive [Tape] Rash  ?  Oxybutynin patch caused blistering rash  ? ? ?Review of Systems  ?Constitutional:  Positive for malaise/fatigue. Negative for fever.  ?HENT:  Negative for ear pain and hearing loss.   ?     (-)nystagmus ?(-)adenopathy  ?Eyes:  Negative for blurred vision.  ?     (+) eye dryness  ?Respiratory:  Negative for cough, shortness of breath and wheezing.   ?Cardiovascular:  Negative for chest pain and leg swelling.  ?Gastrointestinal:  Negative for blood in stool, diarrhea, nausea and vomiting.  ?Genitourinary:  Negative for dysuria and frequency.  ?Musculoskeletal:  Negative for joint pain and myalgias.  ?Skin:  Negative for rash.  ?     (+) acne  ?Neurological:  Negative for headaches.  ?Psychiatric/Behavioral:  Negative for depression. The patient is not nervous/anxious.   ? ?   ?Objective:  ?  ?Physical Exam ?Constitutional:   ?   General: She is not in acute distress. ?   Appearance: Normal appearance. She is not ill-appearing.  ?HENT:  ?   Head: Normocephalic and atraumatic.  ?   Right Ear: Tympanic membrane, ear canal and external ear normal.  ?   Left Ear: Tympanic membrane, ear canal and external ear normal.  ?Eyes:  ?   Extraocular Movements: Extraocular movements intact.  ?   Pupils: Pupils are equal, round, and reactive to light.   ?Cardiovascular:  ?   Rate and Rhythm: Normal rate and regular rhythm.  ?   Pulses: Normal pulses.  ?   Heart sounds: Normal heart sounds. No murmur heard. ?Pulmonary:  ?   Effort: Pulmonary effort is normal. No respiratory distress.  ?   Breath sounds: Normal breath sounds. No wheezing or rhonchi.  ?Abdominal:  ?   General: Bowel sounds are normal.  There is no distension.  ?   Palpations: Abdomen is soft.  ?   Tenderness: There is no abdominal tenderness. There is no guarding or rebound.  ?Musculoskeletal:  ?   Cervical back: Neck supple.  ?   Comments: 5/5 strength in upper and lower extremities  ?Lymphadenopathy:  ?   Cervical: No cervical adenopathy.  ?Skin: ?   General: Skin is warm and dry.  ?Neurological:  ?   Mental Status: She is alert and oriented to person, place, and time.  ?   Deep Tendon Reflexes:  ?   Reflex Scores: ?     Patellar reflexes are 2+ on the right side and 2+ on the left side. ?Psychiatric:     ?   Behavior: Behavior normal.     ?   Judgment: Judgment normal.  ? ? ?BP 118/79 (BP Location: Right Arm, Patient Position: Sitting, Cuff Size: Small)   Pulse 90   Temp 99.1 ?F (37.3 ?C) (Oral)   Resp 16   Ht '5\' 3"'$  (1.6 m)   Wt 150 lb 9.6 oz (68.3 kg)   SpO2 98%   BMI 26.68 kg/m?  ?Wt Readings from Last 3 Encounters:  ?05/23/21 150 lb 9.6 oz (68.3 kg)  ?11/15/20 159 lb 9.6 oz (72.4 kg)  ?05/13/20 158 lb (71.7 kg)  ?Physical Exam  ?Constitutional: She is oriented to person, place, and time. She appears well-developed and well-nourished. No distress.  ?HENT:  ?Head: Normocephalic and atraumatic.  ?Right Ear: Tympanic membrane and ear canal normal.  ?Left Ear: Tympanic membrane and ear canal normal.  ?Mouth/Throat: Oropharynx is clear and moist.  ?Eyes: Pupils are equal, round, and reactive to light. No scleral icterus.  ?Neck: Normal range of motion. No thyromegaly present.  ?Cardiovascular: Normal rate and regular rhythm.   ?No murmur heard. ?Pulmonary/Chest: Effort normal and breath sounds  normal. No respiratory distress. He has no wheezes. She has no rales. She exhibits no tenderness.  ?Abdominal: Soft. Bowel sounds are normal. She exhibits no distension and no mass. There is no tenderness. There is no reb

## 2021-05-23 NOTE — Assessment & Plan Note (Addendum)
Reports hx of hot flashes over the winter.  Now having change in hair texture. Has an IUD so does not have periods. Will check hormonal studies as below.  ?

## 2021-05-24 LAB — ESTRADIOL: Estradiol: 123 pg/mL

## 2021-05-27 ENCOUNTER — Other Ambulatory Visit: Payer: Self-pay | Admitting: Family Medicine

## 2021-05-27 DIAGNOSIS — F988 Other specified behavioral and emotional disorders with onset usually occurring in childhood and adolescence: Secondary | ICD-10-CM

## 2021-05-27 MED ORDER — LISDEXAMFETAMINE DIMESYLATE 50 MG PO CAPS: 50.0000 mg | ORAL_CAPSULE | Freq: Every day | ORAL | 0 refills | Status: DC

## 2021-05-27 NOTE — Telephone Encounter (Signed)
Medication: lisdexamfetamine (VYVANSE) 50 MG capsule  ? ?Has the patient contacted their pharmacy? No. ?(If no, request that the patient contact the pharmacy for the refill.) ?(If yes, when and what did the pharmacy advise?) ? ?Preferred Pharmacy (with phone number or street name):  ?[id number] - [place], [place] - [address] [place]  ?[place], [place] [address]  ?Phone:  [phone]  Fax:  [phone]  ?  ? ? ?

## 2021-05-27 NOTE — Telephone Encounter (Signed)
Requesting: Vyvanse '50mg'$   ?Contract: 11/15/20 ?UDS: 11/15/20 ?Last Visit: 05/23/21 ?Next Visit: 11/24/21 ?Last Refill: 04/28/21 #30 and 0RF ? ?Please Advise ? ?

## 2021-08-19 ENCOUNTER — Other Ambulatory Visit: Payer: Self-pay | Admitting: Family Medicine

## 2021-08-19 DIAGNOSIS — F988 Other specified behavioral and emotional disorders with onset usually occurring in childhood and adolescence: Secondary | ICD-10-CM

## 2021-08-19 NOTE — Telephone Encounter (Signed)
Requesting: Vyvanse Contract: 11/15/2020 UDS:11/15/2020 Last Visit:05/23/2021 Next Visit: 11/24/2021 Last Refill: 05/27/2021  Please Advise

## 2021-08-19 NOTE — Telephone Encounter (Signed)
Medication: lisdexamfetamine (VYVANSE) 50 MG capsule [[id number]]    metoprolol succinate (TOPROL-XL) 25 MG 24 hr tablet [[id number]] -- advised pt she should have more refills on this one and to check her bottle but patient wanted to send request back just in case.   Has the patient contacted their pharmacy? No.  Preferred Pharmacy (with phone number or street name): [facility] #[id number] - [place], [address]  [place], [place] [address]  Phone:  [phone]  Fax:  [phone]   Agent: Please be advised that RX refills may take up to 3 business days. We ask that you follow-up with your pharmacy.

## 2021-08-21 MED ORDER — LISDEXAMFETAMINE DIMESYLATE 50 MG PO CAPS: 50.0000 mg | ORAL_CAPSULE | Freq: Every day | ORAL | 0 refills | Status: DC

## 2021-08-21 MED ORDER — METOPROLOL SUCCINATE ER 25 MG PO TB24: ORAL_TABLET | ORAL | 3 refills | Status: DC

## 2021-09-28 ENCOUNTER — Other Ambulatory Visit: Payer: Self-pay | Admitting: Family Medicine

## 2021-09-28 DIAGNOSIS — F988 Other specified behavioral and emotional disorders with onset usually occurring in childhood and adolescence: Secondary | ICD-10-CM

## 2021-09-29 MED ORDER — LISDEXAMFETAMINE DIMESYLATE 50 MG PO CAPS: 50.0000 mg | ORAL_CAPSULE | Freq: Every day | ORAL | 0 refills | Status: DC

## 2021-09-29 NOTE — Telephone Encounter (Signed)
Requesting: Vyvanse  Contract: 11/15/20 UDS: 11/15/20 Last Visit: 05/23/21 w/ Lenna Sciara Next Visit: 11/24/21 Last Refill: 08/21/21 #30 and 0RF  Please Advise

## 2021-10-25 ENCOUNTER — Other Ambulatory Visit: Payer: Self-pay | Admitting: Family Medicine

## 2021-10-25 DIAGNOSIS — F988 Other specified behavioral and emotional disorders with onset usually occurring in childhood and adolescence: Secondary | ICD-10-CM

## 2021-10-25 MED ORDER — LISDEXAMFETAMINE DIMESYLATE 50 MG PO CAPS: 50.0000 mg | ORAL_CAPSULE | Freq: Every day | ORAL | 0 refills | Status: DC

## 2021-10-25 NOTE — Telephone Encounter (Signed)
Requesting: Vyvanse '30mg'$   Contract: 11/15/20 UDS: 11/15/20 Last Visit: 05/23/21 Next Visit: 05/25/22 Last Refill: 09/29/21 #30 and 0RF   Please Advise

## 2021-11-24 ENCOUNTER — Ambulatory Visit: Payer: Managed Care, Other (non HMO) | Admitting: Family Medicine

## 2021-11-24 VITALS — BP 105/75 | HR 73 | Temp 98.0°F | Resp 16 | Ht 62.0 in | Wt 147.0 lb

## 2021-11-24 DIAGNOSIS — R739 Hyperglycemia, unspecified: Secondary | ICD-10-CM | POA: Diagnosis not present

## 2021-11-24 DIAGNOSIS — E785 Hyperlipidemia, unspecified: Secondary | ICD-10-CM

## 2021-11-24 DIAGNOSIS — R000 Tachycardia, unspecified: Secondary | ICD-10-CM

## 2021-11-24 DIAGNOSIS — K2100 Gastro-esophageal reflux disease with esophagitis, without bleeding: Secondary | ICD-10-CM

## 2021-11-24 DIAGNOSIS — F908 Attention-deficit hyperactivity disorder, other type: Secondary | ICD-10-CM | POA: Diagnosis not present

## 2021-11-24 DIAGNOSIS — F988 Other specified behavioral and emotional disorders with onset usually occurring in childhood and adolescence: Secondary | ICD-10-CM

## 2021-11-24 LAB — TSH: TSH: 1.5 u[IU]/mL (ref 0.35–5.50)

## 2021-11-24 LAB — LIPID PANEL
Cholesterol: 160 mg/dL (ref 0–200)
HDL: 65.8 mg/dL (ref >39.00)
LDL Cholesterol: 81 mg/dL (ref 0–99)
NonHDL: 93.95
Total CHOL/HDL Ratio: 2
Triglycerides: 66 mg/dL (ref 0.0–149.0)
VLDL: 13.2 mg/dL (ref 0.0–40.0)

## 2021-11-24 LAB — CBC
HCT: 41.8 % (ref 36.0–46.0)
Hemoglobin: 13.7 g/dL (ref 12.0–15.0)
MCHC: 32.9 g/dL (ref 30.0–36.0)
MCV: 82.8 fl (ref 78.0–100.0)
Platelets: 280 10*3/uL (ref 150.0–400.0)
RBC: 5.05 Mil/uL (ref 3.87–5.11)
RDW: 12.9 % (ref 11.5–15.5)
WBC: 6.5 10*3/uL (ref 4.0–10.5)

## 2021-11-24 LAB — COMPREHENSIVE METABOLIC PANEL
ALT: 16 U/L (ref 0–35)
AST: 16 U/L (ref 0–37)
Albumin: 4.2 g/dL (ref 3.5–5.2)
Alkaline Phosphatase: 47 U/L (ref 39–117)
BUN: 12 mg/dL (ref 6–23)
CO2: 29 mEq/L (ref 19–32)
Calcium: 9.4 mg/dL (ref 8.4–10.5)
Chloride: 103 mEq/L (ref 96–112)
Creatinine, Ser: 0.75 mg/dL (ref 0.40–1.20)
GFR: 96.1 mL/min (ref >60.00)
Glucose, Bld: 83 mg/dL (ref 70–99)
Potassium: 4.3 mEq/L (ref 3.5–5.1)
Sodium: 137 mEq/L (ref 135–145)
Total Bilirubin: 0.3 mg/dL (ref 0.2–1.2)
Total Protein: 6.7 g/dL (ref 6.0–8.3)

## 2021-11-24 LAB — HEMOGLOBIN A1C: Hgb A1c MFr Bld: 5.4 % (ref 4.6–6.5)

## 2021-11-24 MED ORDER — LISDEXAMFETAMINE DIMESYLATE 50 MG PO CAPS: 50.0000 mg | ORAL_CAPSULE | Freq: Every day | ORAL | 0 refills | Status: DC

## 2021-11-24 NOTE — Patient Instructions (Signed)

## 2021-11-24 NOTE — Assessment & Plan Note (Addendum)
Using Vyvanse 50 mg daily, given 3 months of refills today

## 2021-11-24 NOTE — Assessment & Plan Note (Signed)
Encourage heart healthy diet such as MIND or DASH diet, increase exercise, avoid trans fats, simple carbohydrates and processed foods, consider a krill or fish or flaxseed oil cap daily.  °

## 2021-11-24 NOTE — Progress Notes (Signed)
Subjective:   By signing my name below, I, Katelyn [name], attest that this documentation has been prepared under the direction and in the presence of [name], MD., 11/24/2021.  Patient ID: Katelyn [name], female    DOB: 13-Aug-1975, 46 y.o.   MRN: [id number]  Chief Complaint  Patient presents with   Follow-up    Follow up   HPI Patient is in today for an office visit.  Patient denies having chest pain, palpitations, abdominal pain, diarrhea, constipation, blood in stool, dysuria, urgency, frequency and hematuria.  ADHD: Vyvanse 50 mg is effective at managing patient's ADHD and she is requesting a refill on this.  Colonoscopy: Patient is requesting a colonoscopy referral.  GERD: Patient's GERD has resolved due to weight loss. Wt Readings from Last 3 Encounters:  11/24/21 147 lb (66.7 kg)  05/23/21 150 lb 9.6 oz (68.3 kg)  11/15/20 159 lb 9.6 oz (72.4 kg)   Immunizations: She is not interested in receiving another Covid-19 immunization.  BP Readings from Last 3 Encounters:  11/24/21 105/75  05/23/21 118/79  11/15/20 125/82   Pulse Readings from Last 3 Encounters:  11/24/21 73  05/23/21 90  11/15/20 68   Past Medical History:  Diagnosis Date   Acute bronchitis 01/03/2015   ADD (attention deficit disorder)    Anemia    Anemia, iron deficiency 07/14/2013   Chicken pox as a child   Elevated blood pressure reading 08/11/2016   Fibroid    GERD (gastroesophageal reflux disease)    H/O umbilical hernia repair 11/94/1740   Preventative health care 07/20/2013   Past Surgical History:  Procedure Laterality Date   axillary cyst     left side   MYOMECTOMY  2008   WISDOM TOOTH EXTRACTION     Family History  Problem Relation Age of Onset   Thyroid disease Mother    Colon polyps Father    Ulcers Father        back in the 1960's thinks he had part of his stomach removed   Diabetes Maternal Grandfather        type 2   Diabetes Paternal Grandmother        type 2    Heart attack Paternal Grandfather    Colon cancer Neg Hx    Esophageal cancer Neg Hx    Rectal cancer Neg Hx    Stomach cancer Neg Hx    Social History   Socioeconomic History   Marital status: Married    Spouse name: Not on file   Number of children: 2   Years of education: Not on file   Highest education level: Not on file  Occupational History   Occupation: Hospalist   Tobacco Use   Smoking status: Never   Smokeless tobacco: Never  Vaping Use   Vaping Use: Never used  Substance and Sexual Activity   Alcohol use: Yes    Comment: ocassionally    Drug use: No   Sexual activity: Yes    Birth control/protection: None    Comment: lives with husband and daughter, work at [employer]. No dietary restrictions, MD  Other Topics Concern   Not on file  Social History Narrative   Works as a Psychologist, educational at [employer] Strain: Not on [employer] Insecurity: Not on file  Transportation Needs: Not on file  Physical Activity: Not on file  Stress: Not on file  Social Connections: Not on file  Intimate  Partner Violence: Not on file   Outpatient Medications Prior to Visit  Medication Sig Dispense Refill   benzoyl peroxide-erythromycin (BENZAMYCIN) gel Apply topically 2 (two) times daily. 23.3 g 5   levonorgestrel (MIRENA) 20 MCG/24HR IUD 1 each by Intrauterine route once.     metoprolol succinate (TOPROL-XL) 25 MG 24 hr tablet TAKE 1 TABLET(25 MG) BY MOUTH DAILY 90 tablet 3   XIIDRA 5 % SOLN Apply 1 drop to eye 2 (two) times daily.     lisdexamfetamine (VYVANSE) 50 MG capsule Take 1 capsule (50 mg total) by mouth daily. October 2023 30 capsule 0   famotidine (PEPCID) 40 MG tablet TAKE 1 TABLET(40 MG) BY MOUTH AT BEDTIME AS NEEDED FOR HEARTBURN OR INDIGESTION 90 tablet 1   pantoprazole (PROTONIX) 40 MG tablet TAKE 1 TABLET(40 MG) BY MOUTH TWICE DAILY 90 tablet 3   TYRVAYA 0.03 MG/ACT SOLN Inhale 1 spray into the lungs at bedtime as  needed.     No facility-administered medications prior to visit.   Allergies  Allergen Reactions   Adhesive [Tape] Rash    Oxybutynin patch caused blistering rash   Review of Systems  Constitutional:  Negative for fever and malaise/fatigue.  HENT:  Negative for congestion.   Eyes:  Negative for blurred vision.  Respiratory:  Negative for shortness of breath.   Cardiovascular:  Negative for chest pain, palpitations and leg swelling.  Gastrointestinal:  Negative for abdominal pain, blood in stool, constipation, diarrhea and nausea.  Genitourinary:  Negative for dysuria, frequency, hematuria and urgency.  Musculoskeletal:  Negative for falls.  Skin:  Negative for rash.  Neurological:  Negative for dizziness, loss of consciousness and headaches.  Endo/Heme/Allergies:  Negative for environmental allergies.  Psychiatric/Behavioral:  Negative for depression. The patient is not nervous/anxious.       Objective:    Physical Exam Constitutional:      General: She is not in acute distress.    Appearance: Normal appearance. She is well-developed. She is not ill-appearing.  HENT:     Head: Normocephalic and atraumatic.     Right Ear: External ear normal.     Left Ear: External ear normal.     Mouth/Throat:     Mouth: Mucous membranes are moist.     Pharynx: Oropharynx is clear.  Eyes:     Extraocular Movements: Extraocular movements intact.     Conjunctiva/sclera: Conjunctivae normal.     Pupils: Pupils are equal, round, and reactive to light.  Neck:     Thyroid: No thyromegaly.  Cardiovascular:     Rate and Rhythm: Normal rate and regular rhythm.     Pulses: Normal pulses.     Heart sounds: Normal heart sounds. No murmur heard.    No gallop.  Pulmonary:     Effort: Pulmonary effort is normal. No respiratory distress.     Breath sounds: Normal breath sounds. No wheezing or rales.  Abdominal:     General: Bowel sounds are normal. There is no distension.     Palpations: Abdomen  is soft. There is no mass.     Tenderness: There is no abdominal tenderness.  Musculoskeletal:     Cervical back: Neck supple.  Lymphadenopathy:     Cervical: No cervical adenopathy.  Skin:    General: Skin is warm and dry.  Neurological:     Mental Status: She is alert and oriented to person, place, and time.  Psychiatric:        Mood and Affect: Mood normal.  Behavior: Behavior normal.        Judgment: Judgment normal.    BP 105/75 (BP Location: Right Arm, Patient Position: Sitting, Cuff Size: Normal)   Pulse 73   Temp 98 F (36.7 C) (Oral)   Resp 16   Ht '5\' 2"'$  (1.575 m)   Wt 147 lb (66.7 kg)   SpO2 99%   BMI 26.89 kg/m  Wt Readings from Last 3 Encounters:  11/24/21 147 lb (66.7 kg)  05/23/21 150 lb 9.6 oz (68.3 kg)  11/15/20 159 lb 9.6 oz (72.4 kg)   Diabetic Foot Exam - Simple   No data filed    Lab Results  Component Value Date   WBC 7.7 05/13/2020   HGB 14.1 05/13/2020   HCT 43.0 05/13/2020   PLT 280.0 05/13/2020   GLUCOSE 85 05/13/2020   CHOL 173 05/13/2020   TRIG 94.0 05/13/2020   HDL 61.60 05/13/2020   LDLCALC 93 05/13/2020   ALT 17 05/13/2020   AST 18 05/13/2020   NA 138 05/13/2020   K 4.0 05/13/2020   CL 103 05/13/2020   CREATININE 1.02 05/13/2020   BUN 13 05/13/2020   CO2 27 05/13/2020   TSH 1.63 05/23/2021   HGBA1C 5.5 05/13/2020   Lab Results  Component Value Date   TSH 1.63 05/23/2021   Lab Results  Component Value Date   WBC 7.7 05/13/2020   HGB 14.1 05/13/2020   HCT 43.0 05/13/2020   MCV 83.8 05/13/2020   PLT 280.0 05/13/2020   Lab Results  Component Value Date   NA 138 05/13/2020   K 4.0 05/13/2020   CO2 27 05/13/2020   GLUCOSE 85 05/13/2020   BUN 13 05/13/2020   CREATININE 1.02 05/13/2020   BILITOT 0.4 05/13/2020   ALKPHOS 47 05/13/2020   AST 18 05/13/2020   ALT 17 05/13/2020   PROT 7.5 05/13/2020   ALBUMIN 4.6 05/13/2020   CALCIUM 9.4 05/13/2020   GFR 67.17 05/13/2020   Lab Results  Component Value Date    CHOL 173 05/13/2020   Lab Results  Component Value Date   HDL 61.60 05/13/2020   Lab Results  Component Value Date   LDLCALC 93 05/13/2020   Lab Results  Component Value Date   TRIG 94.0 05/13/2020   Lab Results  Component Value Date   CHOLHDL 3 05/13/2020   Lab Results  Component Value Date   HGBA1C 5.5 05/13/2020      Assessment & Plan:   Problem List Items Addressed This Visit     ADD (attention deficit disorder) - Primary    Using Vyvanse 50 mg daily, given 3 months of refills today      Relevant Medications   lisdexamfetamine (VYVANSE) 50 MG capsule   Hyperlipidemia    Encourage heart healthy diet such as MIND or DASH diet, increase exercise, avoid trans fats, simple carbohydrates and processed foods, consider a krill or fish or flaxseed oil cap daily.        Relevant Orders   Comprehensive metabolic panel   Lipid panel   Tachycardia    RRR      Relevant Orders   CBC   TSH   Gastroesophageal reflux disease    Avoid offending foods, start probiotics. Do not eat large meals in late evening and consider raising head of bed.        Hyperglycemia    hgba1c acceptable, minimize simple carbs. Increase exercise as tolerated.       Relevant Orders  Hemoglobin A1c   Meds ordered this encounter  Medications   lisdexamfetamine (VYVANSE) 50 MG capsule    Sig: Take 1 capsule (50 mg total) by mouth daily. December 2023    Dispense:  30 capsule    Refill:  0   lisdexamfetamine (VYVANSE) 50 MG capsule    Sig: Take 1 capsule (50 mg total) by mouth daily. November 2023    Dispense:  30 capsule    Refill:  0   lisdexamfetamine (VYVANSE) 50 MG capsule    Sig: Take 1 capsule (50 mg total) by mouth daily. January 2024    Dispense:  30 capsule    Refill:  0   I, Penni Homans, MD, personally preformed the services described in this documentation.  All medical record entries made by the scribe were at my direction and in my presence.  I have reviewed the chart  and discharge instructions (if applicable) and agree that the record reflects my personal performance and is accurate and complete. 11/24/2021  I,Mohammed Iqbal,acting as a scribe for Penni Homans, MD.,have documented all relevant documentation on the behalf of Penni Homans, MD,as directed by  Penni Homans, MD while in the presence of Penni Homans, MD.  Penni Homans, MD

## 2021-11-24 NOTE — Assessment & Plan Note (Signed)
Avoid offending foods, start probiotics. Do not eat large meals in late evening and consider raising head of bed.  

## 2021-11-24 NOTE — Assessment & Plan Note (Signed)
hgba1c acceptable, minimize simple carbs. Increase exercise as tolerated.  

## 2021-11-24 NOTE — Assessment & Plan Note (Signed)
RRR 

## 2021-12-17 ENCOUNTER — Other Ambulatory Visit: Payer: Self-pay | Admitting: Family Medicine

## 2021-12-29 ENCOUNTER — Other Ambulatory Visit: Payer: Self-pay | Admitting: Family Medicine

## 2021-12-29 DIAGNOSIS — F988 Other specified behavioral and emotional disorders with onset usually occurring in childhood and adolescence: Secondary | ICD-10-CM

## 2021-12-29 NOTE — Telephone Encounter (Signed)
Pt called to clarify that she would like to send it to the following pharmacy:  [facility] [address] [place], [place],  [address] P: [phone]

## 2021-12-30 ENCOUNTER — Telehealth: Payer: Self-pay | Admitting: *Deleted

## 2021-12-30 MED ORDER — LISDEXAMFETAMINE DIMESYLATE 50 MG PO CAPS: 50.0000 mg | ORAL_CAPSULE | Freq: Every day | ORAL | 0 refills | Status: DC

## 2021-12-30 NOTE — Telephone Encounter (Signed)
Ann from patient insurance called to start prior auth for Vyvanse.  Prior Josem Kaufmann was started.

## 2022-01-02 NOTE — Telephone Encounter (Signed)
Pt called stating that they had denied the PA and had tried to explain to them that it was due to lack of availability for the brand name rather than the generic. She stated she had submitted an appeal and was awaiting the response.

## 2022-01-02 NOTE — Telephone Encounter (Signed)
Urgent appeal letter faxed to [facility].

## 2022-01-19 NOTE — Telephone Encounter (Signed)
Appeal was approved per [facility] with [place].

## 2022-03-27 ENCOUNTER — Telehealth: Payer: Self-pay | Admitting: Family Medicine

## 2022-03-27 NOTE — Telephone Encounter (Signed)
Pt calling to get prior authorization started on the Vyvanse because they leave for [place] on 04/05/22. Please call pt to advise.

## 2022-03-28 ENCOUNTER — Other Ambulatory Visit: Payer: Self-pay

## 2022-03-29 ENCOUNTER — Other Ambulatory Visit: Payer: Self-pay | Admitting: Family Medicine

## 2022-03-29 DIAGNOSIS — F988 Other specified behavioral and emotional disorders with onset usually occurring in childhood and adolescence: Secondary | ICD-10-CM

## 2022-03-29 MED ORDER — LISDEXAMFETAMINE DIMESYLATE 50 MG PO CAPS: 50.0000 mg | ORAL_CAPSULE | Freq: Every day | ORAL | 0 refills | Status: DC

## 2022-03-29 NOTE — Telephone Encounter (Signed)
Pt would like rx switched to a different pharmacy. She also just wanted to make sure the PA was approved.   [id number] - [place], [place] - [address], [place] [address] Phone: [phone]  Fax: [phone]

## 2022-03-30 ENCOUNTER — Other Ambulatory Visit: Payer: Self-pay | Admitting: Family Medicine

## 2022-03-30 ENCOUNTER — Other Ambulatory Visit: Payer: Self-pay

## 2022-03-30 DIAGNOSIS — F988 Other specified behavioral and emotional disorders with onset usually occurring in childhood and adolescence: Secondary | ICD-10-CM

## 2022-03-30 MED ORDER — LISDEXAMFETAMINE DIMESYLATE 50 MG PO CAPS: 50.0000 mg | ORAL_CAPSULE | Freq: Every day | ORAL | 0 refills | Status: DC

## 2022-03-30 NOTE — Telephone Encounter (Signed)
Called pharmacy it was canceled

## 2022-05-25 ENCOUNTER — Ambulatory Visit (INDEPENDENT_AMBULATORY_CARE_PROVIDER_SITE_OTHER): Payer: Managed Care, Other (non HMO) | Admitting: Family Medicine

## 2022-05-25 VITALS — BP 105/78 | HR 79 | Temp 98.0°F | Resp 16 | Ht 63.0 in | Wt 145.8 lb

## 2022-05-25 DIAGNOSIS — E785 Hyperlipidemia, unspecified: Secondary | ICD-10-CM | POA: Diagnosis not present

## 2022-05-25 DIAGNOSIS — F908 Attention-deficit hyperactivity disorder, other type: Secondary | ICD-10-CM

## 2022-05-25 DIAGNOSIS — Z0000 Encounter for general adult medical examination without abnormal findings: Secondary | ICD-10-CM | POA: Diagnosis not present

## 2022-05-25 DIAGNOSIS — R000 Tachycardia, unspecified: Secondary | ICD-10-CM

## 2022-05-25 DIAGNOSIS — Z1211 Encounter for screening for malignant neoplasm of colon: Secondary | ICD-10-CM

## 2022-05-25 DIAGNOSIS — Z79899 Other long term (current) drug therapy: Secondary | ICD-10-CM

## 2022-05-25 DIAGNOSIS — R739 Hyperglycemia, unspecified: Secondary | ICD-10-CM | POA: Diagnosis not present

## 2022-05-25 DIAGNOSIS — R232 Flushing: Secondary | ICD-10-CM

## 2022-05-25 DIAGNOSIS — Z8619 Personal history of other infectious and parasitic diseases: Secondary | ICD-10-CM

## 2022-05-25 LAB — COMPREHENSIVE METABOLIC PANEL
ALT: 14 U/L (ref 0–35)
AST: 16 U/L (ref 0–37)
Albumin: 4.1 g/dL (ref 3.5–5.2)
Alkaline Phosphatase: 53 U/L (ref 39–117)
BUN: 10 mg/dL (ref 6–23)
CO2: 26 mEq/L (ref 19–32)
Calcium: 8.8 mg/dL (ref 8.4–10.5)
Chloride: 103 mEq/L (ref 96–112)
Creatinine, Ser: 0.93 mg/dL (ref 0.40–1.20)
GFR: 73.98 mL/min (ref >60.00)
Glucose, Bld: 80 mg/dL (ref 70–99)
Potassium: 3.8 mEq/L (ref 3.5–5.1)
Sodium: 138 mEq/L (ref 135–145)
Total Bilirubin: 0.5 mg/dL (ref 0.2–1.2)
Total Protein: 6.9 g/dL (ref 6.0–8.3)

## 2022-05-25 LAB — LIPID PANEL
Cholesterol: 152 mg/dL (ref 0–200)
HDL: 57.3 mg/dL (ref >39.00)
LDL Cholesterol: 78 mg/dL (ref 0–99)
NonHDL: 94.89
Total CHOL/HDL Ratio: 3
Triglycerides: 86 mg/dL (ref 0.0–149.0)
VLDL: 17.2 mg/dL (ref 0.0–40.0)

## 2022-05-25 LAB — CBC WITH DIFFERENTIAL/PLATELET
Basophils Absolute: 0 10*3/uL (ref 0.0–0.1)
Basophils Relative: 0.4 % (ref 0.0–3.0)
Eosinophils Absolute: 0.4 10*3/uL (ref 0.0–0.7)
Eosinophils Relative: 4.4 % (ref 0.0–5.0)
HCT: 43 % (ref 36.0–46.0)
Hemoglobin: 14.1 g/dL (ref 12.0–15.0)
Lymphocytes Relative: 19.5 % (ref 12.0–46.0)
Lymphs Abs: 1.6 10*3/uL (ref 0.7–4.0)
MCHC: 32.7 g/dL (ref 30.0–36.0)
MCV: 84 fl (ref 78.0–100.0)
Monocytes Absolute: 0.8 10*3/uL (ref 0.1–1.0)
Monocytes Relative: 9.8 % (ref 3.0–12.0)
Neutro Abs: 5.3 10*3/uL (ref 1.4–7.7)
Neutrophils Relative %: 65.9 % (ref 43.0–77.0)
Platelets: 295 10*3/uL (ref 150.0–400.0)
RBC: 5.12 Mil/uL — ABNORMAL HIGH (ref 3.87–5.11)
RDW: 14 % (ref 11.5–15.5)
WBC: 8 10*3/uL (ref 4.0–10.5)

## 2022-05-25 LAB — TSH: TSH: 3.17 u[IU]/mL (ref 0.35–5.50)

## 2022-05-25 LAB — HEMOGLOBIN A1C: Hgb A1c MFr Bld: 5.2 % (ref 4.6–6.5)

## 2022-05-25 NOTE — Assessment & Plan Note (Signed)
Encourage heart healthy diet such as MIND or DASH diet, increase exercise, avoid trans fats, simple carbohydrates and processed foods, consider a krill or fish or flaxseed oil cap daily.  °

## 2022-05-25 NOTE — Progress Notes (Signed)
Subjective:   By signing my name below, I, Katelyn [name], attest that this documentation has been prepared under the direction and in the presence of Bradd Canary, MD., 05/25/2022.   Patient ID: Katelyn [name], female    DOB: 03-31-1975, 47 y.o.   MRN: [id number]  Chief Complaint  Patient presents with   Annual Exam    Annual Exam    HPI Patient is in today for a comprehensive physical exam and follow up on chronic medical concerns.  ADD (attention deficit disorder) Patient continues taking Vyvanse 50 mg once daily to manage her ADD, which has been tolerable. She has renewed her controlled substance contract today.  Colonoscopy Patient is requesting a referral for a colonoscopy. She would like to see Dr. Barron Alvine and would like the initial consult to be virtual. Her last colonoscopy was completed on 01/16/2009, and one cecal polyp was discovered and removed. She denies any GI concerns today.  Menopause Patient reports that she was seen by her gynecologist in 12/2021 due to increased stress, flares of acne rosacea, and curling of her hair. She has been using Dial soap to manage the acne which she says has been effective. She was also prescribed estrogen patches which have been tolerable, straightening her hair, and reducing her stress. She continues using these patches and states that she is now sleeping well. Additionally, she was referred to psychiatry to better manage her stress.  Shingles Patient reports that she was admitted to [facility] in 11/2021 due to Shingles. She states that she experienced hypersensitivity in both the bottom and top lip as well as the nose.  She was prescribed Valtrex which she only took for one week and was tolerable. She is doing well today and denies any residual symptoms. She denies CP/palpitations/SOB/HA/fever/chills/GI or GU symptoms. She will be eligible for Shingrix vaccinations at 47 yo.  Past Medical History:  Diagnosis Date    Acute bronchitis 01/03/2015   ADD (attention deficit disorder)    Anemia    Anemia, iron deficiency 07/14/2013   Chicken pox as a child   Elevated blood pressure reading 08/11/2016   Fibroid    GERD (gastroesophageal reflux disease)    H/O umbilical hernia repair 07/10/2016   Preventative health care 07/20/2013    Past Surgical History:  Procedure Laterality Date   axillary cyst     left side   MYOMECTOMY  2008   WISDOM TOOTH EXTRACTION      Family History  Problem Relation Age of Onset   Thyroid disease Mother    Colon polyps Father    Ulcers Father        back in the 1960's thinks he had part of his stomach removed   Diabetes Maternal Grandfather        type 2   Diabetes Paternal Grandmother        type 2   Heart attack Paternal Grandfather    Colon cancer Neg Hx    Esophageal cancer Neg Hx    Rectal cancer Neg Hx    Stomach cancer Neg Hx     Social History   Socioeconomic History   Marital status: Married    Spouse name: Not on file   Number of children: 2   Years of education: Not on file   Highest education level: Not on file  Occupational History   Occupation: Hospalist   Tobacco Use   Smoking status: Never   Smokeless tobacco: Never  Vaping Use  Vaping Use: Never used  Substance and Sexual Activity   Alcohol use: Yes    Comment: ocassionally    Drug use: No   Sexual activity: Yes    Birth control/protection: None    Comment: lives with husband and daughter, work at [employer]. No dietary restrictions, MD  Other Topics Concern   Not on file  Social History Narrative   Works as a Licensed conveyancer at [employer] Strain: Not on [employer] Insecurity: Not on file  Transportation Needs: Not on file  Physical Activity: Not on file  Stress: Not on file  Social Connections: Not on file  Intimate Partner Violence: Not on file    Outpatient Medications Prior to Visit  Medication Sig Dispense Refill    levonorgestrel (MIRENA) 20 MCG/24HR IUD 1 each by Intrauterine route once.     metoprolol succinate (TOPROL-XL) 25 MG 24 hr tablet TAKE 1 TABLET(25 MG) BY MOUTH DAILY 90 tablet 3   XIIDRA 5 % SOLN Apply 1 drop to eye 2 (two) times daily.     lisdexamfetamine (VYVANSE) 50 MG capsule Take 1 capsule (50 mg total) by mouth daily. April 2024 30 capsule 0   lisdexamfetamine (VYVANSE) 50 MG capsule Take 1 capsule (50 mg total) by mouth daily. May 2024 30 capsule 0   lisdexamfetamine (VYVANSE) 50 MG capsule Take 1 capsule (50 mg total) by mouth daily. April 2024 30 capsule 0   lisdexamfetamine (VYVANSE) 50 MG capsule Take 1 capsule (50 mg total) by mouth daily. March 2024 30 capsule 0   benzoyl peroxide-erythromycin (BENZAMYCIN) gel Apply topically 2 (two) times daily. 23.3 g 5   No facility-administered medications prior to visit.    Allergies  Allergen Reactions   Adhesive [Tape] Rash    Oxybutynin patch caused blistering rash    Review of Systems  Constitutional:  Negative for chills and fever.  Respiratory:  Negative for shortness of breath.   Cardiovascular:  Negative for chest pain and palpitations.  Gastrointestinal:  Negative for abdominal pain, blood in stool, constipation, diarrhea, nausea and vomiting.  Genitourinary:  Negative for dysuria, frequency, hematuria and urgency.  Skin:           Neurological:  Negative for headaches.       Objective:    Physical Exam Constitutional:      General: She is not in acute distress.    Appearance: Normal appearance. She is not ill-appearing.  HENT:     Head: Normocephalic and atraumatic.     Right Ear: Tympanic membrane, ear canal and external ear normal.     Left Ear: Tympanic membrane, ear canal and external ear normal.     Nose: Nose normal.     Mouth/Throat:     Mouth: Mucous membranes are moist.     Pharynx: Oropharynx is clear.  Eyes:     General:        Right eye: No discharge.        Left eye: No discharge.      Extraocular Movements: Extraocular movements intact.     Right eye: No nystagmus.     Left eye: No nystagmus.     Pupils: Pupils are equal, round, and reactive to light.  Neck:     Vascular: No carotid bruit.  Cardiovascular:     Rate and Rhythm: Normal rate and regular rhythm.     Pulses: Normal pulses.     Heart sounds: Normal heart  sounds. No murmur heard.    No gallop.  Pulmonary:     Effort: Pulmonary effort is normal. No respiratory distress.     Breath sounds: Normal breath sounds. No wheezing or rales.  Abdominal:     General: Bowel sounds are normal.     Palpations: Abdomen is soft.     Tenderness: There is no abdominal tenderness. There is no guarding.  Musculoskeletal:        General: Normal range of motion.     Cervical back: Normal range of motion.     Right lower leg: No edema.     Left lower leg: No edema.     Comments: Muscle strength 5/5 on upper and lower extremities.   Lymphadenopathy:     Cervical: No cervical adenopathy.  Skin:    General: Skin is warm and dry.  Neurological:     Mental Status: She is alert and oriented to person, place, and time.     Sensory: Sensation is intact.     Motor: Motor function is intact.     Coordination: Coordination is intact.     Deep Tendon Reflexes:     Reflex Scores:      Patellar reflexes are 2+ on the right side and 2+ on the left side. Psychiatric:        Mood and Affect: Mood normal.        Behavior: Behavior normal.        Judgment: Judgment normal.     BP 105/78 (BP Location: Right Arm, Patient Position: Sitting, Cuff Size: Normal)   Pulse 79   Temp 98 F (36.7 C) (Oral)   Resp 16   Ht 5\' 3"  (1.6 m)   Wt 145 lb 12.8 oz (66.1 kg)   SpO2 99%   BMI 25.83 kg/m  Wt Readings from Last 3 Encounters:  05/25/22 145 lb 12.8 oz (66.1 kg)  11/24/21 147 lb (66.7 kg)  05/23/21 150 lb 9.6 oz (68.3 kg)    Diabetic Foot Exam - Simple   No data filed    Lab Results  Component Value Date   WBC 8.0 05/25/2022    HGB 14.1 05/25/2022   HCT 43.0 05/25/2022   PLT 295.0 05/25/2022   GLUCOSE 80 05/25/2022   CHOL 152 05/25/2022   TRIG 86.0 05/25/2022   HDL 57.30 05/25/2022   LDLCALC 78 05/25/2022   ALT 14 05/25/2022   AST 16 05/25/2022   NA 138 05/25/2022   K 3.8 05/25/2022   CL 103 05/25/2022   CREATININE 0.93 05/25/2022   BUN 10 05/25/2022   CO2 26 05/25/2022   TSH 3.17 05/25/2022   HGBA1C 5.2 05/25/2022    Lab Results  Component Value Date   TSH 3.17 05/25/2022   Lab Results  Component Value Date   WBC 8.0 05/25/2022   HGB 14.1 05/25/2022   HCT 43.0 05/25/2022   MCV 84.0 05/25/2022   PLT 295.0 05/25/2022   Lab Results  Component Value Date   NA 138 05/25/2022   K 3.8 05/25/2022   CO2 26 05/25/2022   GLUCOSE 80 05/25/2022   BUN 10 05/25/2022   CREATININE 0.93 05/25/2022   BILITOT 0.5 05/25/2022   ALKPHOS 53 05/25/2022   AST 16 05/25/2022   ALT 14 05/25/2022   PROT 6.9 05/25/2022   ALBUMIN 4.1 05/25/2022   CALCIUM 8.8 05/25/2022   GFR 73.98 05/25/2022   Lab Results  Component Value Date   CHOL 152 05/25/2022   Lab Results  Component Value Date   HDL 57.30 05/25/2022   Lab Results  Component Value Date   LDLCALC 78 05/25/2022   Lab Results  Component Value Date   TRIG 86.0 05/25/2022   Lab Results  Component Value Date   CHOLHDL 3 05/25/2022   Lab Results  Component Value Date   HGBA1C 5.2 05/25/2022      Assessment & Plan:  Colonoscopy: Last completed on 01/16/2009. One cecal polyp was discovered and removed. Patient would like to follow with Dr. Barron Alvine for a repeat colonoscopy and would like the initial consult to be virtual. Referral has been placed.  Mammogram: Need records from [facility] from 12/2021 visit.  Pap Smear: Need records from [facility] from 12/2021 visit.  Advanced Directives: Encouraged patient to complete advanced care planning documents.  Healthy Lifestyle: Encouraged 6-8 hours of sleep, heart healthy  diet, 60-80 oz of non-alcohol/non-caffeinated fluids, and 4000-8000 steps daily.  Immunizations: Encouraged patient to consider annual COVID-19 and Influenza vaccinations. Tetanus due on ar after 06/2023 unless injured before then.  Labs: Routine blood work ordered today.  ADD (attention deficit disorder): This is well-controlled with Vyvanse 50 mg once daily. Refilled for June, July, and August. Problem List Items Addressed This Visit     ADD (attention deficit disorder)    Refills given for Vyvanse      History of shingles    Right facial in the Fall went to UC an received Valtrex essentially resolved. Recommend Shingrix at 51      Hot flashes    She reports increased stress, acne, curly hair was new. She was on Mirenaand they added an estrogen patch. Hair straightened out, she is resting better. Acne is better also. She is calmer as well      Hyperglycemia - Primary    hgba1c acceptable, minimize simple carbs. Increase exercise as tolerated.       Relevant Orders   Hemoglobin A1c (Completed)   Hyperlipidemia    Encourage heart healthy diet such as MIND or DASH diet, increase exercise, avoid trans fats, simple carbohydrates and processed foods, consider a krill or fish or flaxseed oil cap daily.        Relevant Orders   Lipid panel (Completed)   Preventative health care    Patient encouraged to maintain heart healthy diet, regular exercise, adequate sleep. Consider daily probiotics. Take medications as prescribed. Follows with [facility] she will follow up with them for pap and mgm 10/22. Colonoscopy at age 76, ordered. Labs ordered and reviewed. Given and reviewed copy of ACP documents from [facility] Secretary of State and encouraged to complete and return       Tachycardia    Doing well on Metoprolol, RRR today      Relevant Orders   CBC with Differential/Platelet (Completed)   Comprehensive metabolic panel (Completed)   TSH (Completed)   Other Visit Diagnoses      Colon cancer screening       Relevant Orders   Ambulatory referral to Gastroenterology   High risk medication use       Relevant Orders   Drug Monitoring Panel [phone] , Urine      Meds ordered this encounter  Medications   lisdexamfetamine (VYVANSE) 50 MG capsule    Sig: Take 1 capsule (50 mg total) by mouth daily. June 2024    Dispense:  30 capsule    Refill:  0   lisdexamfetamine (VYVANSE) 50 MG capsule    Sig: Take  1 capsule (50 mg total) by mouth daily. July 2024    Dispense:  30 capsule    Refill:  0   lisdexamfetamine (VYVANSE) 50 MG capsule    Sig: Take 1 capsule (50 mg total) by mouth daily. August 2024    Dispense:  30 capsule    Refill:  0   I, Danise Edge, MD, personally preformed the services described in this documentation.  All medical record entries made by the scribe were at my direction and in my presence.  I have reviewed the chart and discharge instructions (if applicable) and agree that the record reflects my personal performance and is accurate and complete. 05/25/2022  I,Mohammed Iqbal,acting as a scribe for Danise Edge, MD.,have documented all relevant documentation on the behalf of Danise Edge, MD,as directed by  Danise Edge, MD while in the presence of Danise Edge, MD.  Danise Edge, MD

## 2022-05-25 NOTE — Assessment & Plan Note (Signed)
Patient encouraged to maintain heart healthy diet, regular exercise, adequate sleep. Consider daily probiotics. Take medications as prescribed. Follows with [facility] she will follow up with them for pap and mgm 10/22. Colonoscopy at age 47, ordered. Labs ordered and reviewed. Given and reviewed copy of ACP documents from [employer] and encouraged to complete and return

## 2022-05-25 NOTE — Assessment & Plan Note (Addendum)
Right facial in the Fall went to UC an received Valtrex essentially resolved. Recommend Shingrix at 51

## 2022-05-25 NOTE — Assessment & Plan Note (Signed)
Doing well on Metoprolol, RRR today

## 2022-05-25 NOTE — Assessment & Plan Note (Addendum)
She reports increased stress, acne, curly hair was new. She was on Mirenaand they added an estrogen patch. Hair straightened out, she is resting better. Acne is better also. She is calmer as well

## 2022-05-25 NOTE — Patient Instructions (Signed)

## 2022-05-25 NOTE — Assessment & Plan Note (Signed)
hgba1c acceptable, minimize simple carbs. Increase exercise as tolerated.  

## 2022-05-26 ENCOUNTER — Encounter: Payer: Self-pay | Admitting: Family Medicine

## 2022-05-26 MED ORDER — LISDEXAMFETAMINE DIMESYLATE 50 MG PO CAPS: 50.0000 mg | ORAL_CAPSULE | Freq: Every day | ORAL | 0 refills | Status: DC

## 2022-05-26 NOTE — Assessment & Plan Note (Signed)
Refills given for Vyvanse

## 2022-05-28 LAB — DRUG MONITORING PANEL 376104, URINE
Amphetamine: >15000 ng/mL — ABNORMAL HIGH (ref <250)
Amphetamines: POSITIVE ng/mL — AB (ref <500)
Barbiturates: NEGATIVE ng/mL (ref <300)
Benzodiazepines: NEGATIVE ng/mL (ref <100)
Cocaine Metabolite: NEGATIVE ng/mL (ref <150)
Desmethyltramadol: NEGATIVE ng/mL (ref <100)
Methamphetamine: NEGATIVE ng/mL (ref <250)
Opiates: NEGATIVE ng/mL (ref <100)
Oxycodone: NEGATIVE ng/mL (ref <100)
Tramadol: NEGATIVE ng/mL (ref <100)

## 2022-05-28 LAB — DM TEMPLATE

## 2022-07-03 ENCOUNTER — Other Ambulatory Visit: Payer: Self-pay | Admitting: Family Medicine

## 2022-07-03 MED ORDER — LISDEXAMFETAMINE DIMESYLATE 50 MG PO CAPS: 50.0000 mg | ORAL_CAPSULE | Freq: Every day | ORAL | 0 refills | Status: DC

## 2022-07-03 NOTE — Telephone Encounter (Signed)
See Pt message-    I am so sorry but all of these were sent to the wrong pharmacy ----it should be [facility] in [place].  I just tried to delete this pharmacy from my profile but it's still linked to these below. I did also call this pharmacy to make sure it wasn't newly available but it is not.

## 2022-10-05 ENCOUNTER — Other Ambulatory Visit: Payer: Self-pay | Admitting: Family Medicine

## 2022-10-05 MED ORDER — LISDEXAMFETAMINE DIMESYLATE 50 MG PO CAPS: 50.0000 mg | ORAL_CAPSULE | Freq: Every day | ORAL | 0 refills | Status: DC

## 2022-10-05 NOTE — Telephone Encounter (Signed)
Requesting: lisdexamfetamine (VYVANSE) 50 MG capsule  Contract: 05/25/2022 UDS: 05/25/2022 Last Visit: 05/25/2022 Next Visit: 11/28/2022 Last Refill: 07/03/2022 - written for "August" #30 with no refills  Please Advise

## 2022-10-09 ENCOUNTER — Telehealth: Payer: Self-pay | Admitting: Family Medicine

## 2022-10-09 NOTE — Telephone Encounter (Signed)
Pt called stating that her pharmacy had informed her that she needed another PA for her vyvanse. After reviewing chart, noted that a PA was approved for this medication on 1.4.24 but no effective date were included in note. Advised a note would be sent back to look into this and we would call her back with more info once available.

## 2022-10-11 ENCOUNTER — Other Ambulatory Visit: Payer: Self-pay | Admitting: Family

## 2022-10-11 ENCOUNTER — Other Ambulatory Visit (HOSPITAL_BASED_OUTPATIENT_CLINIC_OR_DEPARTMENT_OTHER): Payer: Self-pay

## 2022-10-11 ENCOUNTER — Other Ambulatory Visit: Payer: Self-pay | Admitting: Family Medicine

## 2022-10-11 ENCOUNTER — Other Ambulatory Visit: Payer: Self-pay

## 2022-10-11 ENCOUNTER — Other Ambulatory Visit (HOSPITAL_COMMUNITY): Payer: Self-pay

## 2022-10-11 MED ORDER — LISDEXAMFETAMINE DIMESYLATE 50 MG PO CAPS
50.0000 mg | ORAL_CAPSULE | Freq: Every day | ORAL | 0 refills | Status: DC
  Filled 2022-10-11: qty 30, 30d supply, fill #0

## 2022-10-11 NOTE — Telephone Encounter (Signed)
Per test claim, generic is covered for $10.00. Is brand name medically necessary, if so, please explain why. Thanks

## 2022-10-11 NOTE — Telephone Encounter (Signed)
Message sent to pt.

## 2022-10-12 ENCOUNTER — Other Ambulatory Visit (HOSPITAL_BASED_OUTPATIENT_CLINIC_OR_DEPARTMENT_OTHER): Payer: Self-pay

## 2022-10-12 ENCOUNTER — Other Ambulatory Visit: Payer: Self-pay

## 2022-10-19 ENCOUNTER — Ambulatory Visit (AMBULATORY_SURGERY_CENTER): Payer: Managed Care, Other (non HMO)

## 2022-10-19 ENCOUNTER — Encounter: Payer: Self-pay | Admitting: Gastroenterology

## 2022-10-19 VITALS — Ht 63.0 in | Wt 144.6 lb

## 2022-10-19 DIAGNOSIS — Z1211 Encounter for screening for malignant neoplasm of colon: Secondary | ICD-10-CM

## 2022-10-19 MED ORDER — NA SULFATE-K SULFATE-MG SULF 17.5-3.13-1.6 GM/177ML PO SOLN
1.0000 | Freq: Once | ORAL | 0 refills | Status: AC
Start: 2022-10-19 — End: 2022-10-19

## 2022-10-19 NOTE — Progress Notes (Signed)
No egg or soy allergy known to patient  No issues known to pt with past sedation with any surgeries or procedures Patient denies ever being told they had issues or difficulty with intubation  No FH of Malignant Hyperthermia Pt is not on diet pills Pt is not on  home 02  Pt is not on blood thinners  Pt reports constipation. BM every 2-3 days.  No A fib or A flutter Have any cardiac testing pending--no  LOA: independent  Prep: suprep   Patient's chart reviewed by Cathlyn Parsons CNRA prior to previsit and patient appropriate for the [facility].  Previsit completed and red dot placed by patient's name on their procedure day (on provider's schedule).     PV competed with patient. Prep instructions sent via mychart and hard copy given at [facility]. Goodrx coupon for [employer] provided to use for price reduction if needed.

## 2022-10-22 ENCOUNTER — Encounter: Payer: Self-pay | Admitting: Certified Registered Nurse Anesthetist

## 2022-10-25 ENCOUNTER — Ambulatory Visit: Payer: Managed Care, Other (non HMO) | Admitting: Gastroenterology

## 2022-10-25 ENCOUNTER — Encounter: Payer: Self-pay | Admitting: Gastroenterology

## 2022-10-25 VITALS — BP 113/75 | HR 81 | Temp 98.0°F | Resp 16 | Ht 63.0 in | Wt 144.6 lb

## 2022-10-25 DIAGNOSIS — K635 Polyp of colon: Secondary | ICD-10-CM

## 2022-10-25 DIAGNOSIS — Z1211 Encounter for screening for malignant neoplasm of colon: Secondary | ICD-10-CM | POA: Diagnosis present

## 2022-10-25 DIAGNOSIS — K5730 Diverticulosis of large intestine without perforation or abscess without bleeding: Secondary | ICD-10-CM

## 2022-10-25 DIAGNOSIS — D124 Benign neoplasm of descending colon: Secondary | ICD-10-CM

## 2022-10-25 DIAGNOSIS — D125 Benign neoplasm of sigmoid colon: Secondary | ICD-10-CM

## 2022-10-25 DIAGNOSIS — K641 Second degree hemorrhoids: Secondary | ICD-10-CM

## 2022-10-25 MED ORDER — SODIUM CHLORIDE 0.9 % IV SOLN: 500.0000 mL | INTRAVENOUS | Status: DC

## 2022-10-25 NOTE — Progress Notes (Signed)
0930 HR > 100 with esmolol 25 mg given IV, MD updated, vss

## 2022-10-25 NOTE — Progress Notes (Signed)
Pt's states no medical or surgical changes since previsit or office visit. 

## 2022-10-25 NOTE — Progress Notes (Signed)
Called to room to assist during endoscopic procedure.  Patient ID and intended procedure confirmed with present staff. Received instructions for my participation in the procedure from the performing physician.  

## 2022-10-25 NOTE — Patient Instructions (Signed)
Please read handouts provided. Continue present medications. Await pathology results. Return to GI clinic as needed.   YOU HAD AN ENDOSCOPIC PROCEDURE TODAY AT THE [facility]:   Refer to the procedure report that was given to you for any specific questions about what was found during the examination.  If the procedure report does not answer your questions, please call your gastroenterologist to clarify.  If you requested that your care partner not be given the details of your procedure findings, then the procedure report has been included in a sealed envelope for you to review at your convenience later.  YOU SHOULD EXPECT: Some feelings of bloating in the abdomen. Passage of more gas than usual.  Walking can help get rid of the air that was put into your GI tract during the procedure and reduce the bloating. If you had a lower endoscopy (such as a colonoscopy or flexible sigmoidoscopy) you may notice spotting of blood in your stool or on the toilet paper. If you underwent a bowel prep for your procedure, you may not have a normal bowel movement for a few days.  Please Note:  You might notice some irritation and congestion in your nose or some drainage.  This is from the oxygen used during your procedure.  There is no need for concern and it should clear up in a day or so.  SYMPTOMS TO REPORT IMMEDIATELY:  Following lower endoscopy (colonoscopy or flexible sigmoidoscopy):  Excessive amounts of blood in the stool  Significant tenderness or worsening of abdominal pains  Swelling of the abdomen that is new, acute  Fever of 100F or higher  For urgent or emergent issues, a gastroenterologist can be reached at any hour by calling [phone]. Do not use MyChart messaging for urgent concerns.    DIET:  We do recommend a small meal at first, but then you may proceed to your regular diet.  Drink plenty of fluids but you should avoid alcoholic beverages for 24 hours.  ACTIVITY:  You  should plan to take it easy for the rest of today and you should NOT DRIVE or use heavy machinery until tomorrow (because of the sedation medicines used during the test).    FOLLOW UP: Our staff will call the number listed on your records the next business day following your procedure.  We will call around 7:15- 8:00 am to check on you and address any questions or concerns that you may have regarding the information given to you following your procedure. If we do not reach you, we will leave a message.     If any biopsies were taken you will be contacted by phone or by letter within the next 1-3 weeks.  Please call us at [phone] if you have not heard about the biopsies in 3 weeks.    SIGNATURES/CONFIDENTIALITY: You and/or your care partner have signed paperwork which will be entered into your electronic medical record.  These signatures attest to the fact that that the information above on your After Visit Summary has been reviewed and is understood.  Full responsibility of the confidentiality of this discharge information lies with you and/or your care-partner. 

## 2022-10-25 NOTE — Progress Notes (Signed)
0937 HR > 100 with esmolol 25 mg given IV, MD updated, vss

## 2022-10-25 NOTE — Progress Notes (Signed)
0925 HR > 100 with esmolol 25 mg given IV, MD updated, vss

## 2022-10-25 NOTE — Op Note (Signed)
[facility] Patient Name: Katelyn [name] Procedure Date: 10/25/2022 9:18 AM MRN: [id number] Endoscopist: Doristine Locks , MD, [id number] Age: 47 Referring MD:  Date of Birth: 01/31/1975 Gender: Female Account #: [id number] Procedure:                Colonoscopy Indications:              Screening for colorectal malignant neoplasm, This                            is the patient's first colonoscopy Medicines:                Monitored Anesthesia Care Procedure:                Pre-Anesthesia Assessment:                           - Prior to the procedure, a History and Physical                            was performed, and patient medications and                            allergies were reviewed. The patient's tolerance of                            previous anesthesia was also reviewed. The risks                            and benefits of the procedure and the sedation                            options and risks were discussed with the patient.                            All questions were answered, and informed consent                            was obtained. Prior Anticoagulants: The patient has                            taken no anticoagulant or antiplatelet agents. ASA                            Grade Assessment: II - A patient with mild systemic                            disease. After reviewing the risks and benefits,                            the patient was deemed in satisfactory condition to                            undergo the procedure.  After obtaining informed consent, the colonoscope                            was passed under direct vision. Throughout the                            procedure, the patient's blood pressure, pulse, and                            oxygen saturations were monitored continuously. The                            Olympus Scope [id number] was introduced through the                            anus and advanced  to the the cecum, identified by                            appendiceal orifice and ileocecal valve. The                            colonoscopy was performed without difficulty. The                            patient tolerated the procedure well. The quality                            of the bowel preparation was excellent. The                            ileocecal valve, appendiceal orifice, and rectum                            were photographed. Scope In: 9:21:39 AM Scope Out: 9:38:34 AM Scope Withdrawal Time: 0 hours 13 minutes 3 seconds  Total Procedure Duration: 0 hours 16 minutes 55 seconds  Findings:                 Skin tags were found on perianal exam.                           A 4 mm polyp was found in the descending colon. The                            polyp was sessile. The polyp was removed with a                            cold snare. Resection and retrieval were complete.                            Estimated blood loss was minimal.                           Three sessile polyps were found in the sigmoid  colon. The polyps were 2 to 4 mm in size. These                            polyps were removed with a cold snare. Resection                            and retrieval were complete. Estimated blood loss                            was minimal.                           Multiple large-mouthed and small-mouthed                            diverticula were found in the sigmoid colon,                            descending colon and ascending colon.                           Non-bleeding internal hemorrhoids were found during                            retroflexion. The hemorrhoids were small. Complications:            No immediate complications. Estimated Blood Loss:     Estimated blood loss was minimal. Impression:               - Perianal skin tags found on perianal exam.                           - One 4 mm polyp in the descending colon, removed                             with a cold snare. Resected and retrieved.                           - Three 2 to 4 mm polyps in the sigmoid colon,                            removed with a cold snare. Resected and retrieved.                           - Diverticulosis in the sigmoid colon, in the                            descending colon and in the ascending colon.                           - Non-bleeding internal hemorrhoids. Recommendation:           - Patient has a contact number available for  emergencies. The signs and symptoms of potential                            delayed complications were discussed with the                            patient. Return to normal activities tomorrow.                            Written discharge instructions were provided to the                            patient.                           - Resume previous diet.                           - Continue present medications.                           - Await pathology results.                           - Repeat colonoscopy for surveillance based on                            pathology results.                           - Return to GI clinic PRN. Doristine Locks, MD 10/25/2022 9:45:13 AM

## 2022-10-25 NOTE — Progress Notes (Signed)
GASTROENTEROLOGY PROCEDURE H&P NOTE   Primary Care Physician: Katelyn Canary, MD    Reason for Procedure:  Colon Cancer screening  Plan:    Colonoscopy  Patient is appropriate for endoscopic procedure(s) in the ambulatory ([facility]) setting.  The nature of the procedure, as well as the risks, benefits, and alternatives were carefully and thoroughly reviewed with the patient. Ample time for discussion and questions allowed. The patient understood, was satisfied, and agreed to proceed.     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  No active lower GI symptoms.  Father w/ colon polyps. No known family history of colon cancer or related malignancy.  Patient is otherwise without complaints or active issues today.  Past Medical History:  Diagnosis Date   Acute bronchitis 01/03/2015   ADD (attention deficit disorder)    Anemia    Anemia, iron deficiency 07/14/2013   Chicken pox as a child   Elevated blood pressure reading 08/11/2016   Fibroid    GERD (gastroesophageal reflux disease)    H/O umbilical hernia repair 07/10/2016   Preventative health care 07/20/2013    Past Surgical History:  Procedure Laterality Date   axillary cyst     left side   MYOMECTOMY  2008   WISDOM TOOTH EXTRACTION      Prior to Admission medications   Medication Sig Start Date End Date Taking? Authorizing Provider  doxycycline (PERIOSTAT) 20 MG tablet Take 20 mg by mouth 2 (two) times daily.    [provider]  estradiol (VIVELLE-DOT) 0.05 MG/24HR patch Place 1 patch onto the skin 2 (two) times a week. 05/18/22   [provider]  finasteride (PROSCAR) 5 MG tablet Take 5 mg by mouth daily.    [provider]  levonorgestrel (MIRENA) 20 MCG/24HR IUD 1 each by Intrauterine route once.    [provider]  lisdexamfetamine (VYVANSE) 50 MG capsule Take 1 capsule (50 mg total) by mouth daily. July 2024 07/03/22    Katelyn Canary, MD  lisdexamfetamine (VYVANSE) 50 MG capsule Take 1 capsule (50 mg total) by mouth daily. September 2024 10/05/22   Katelyn Canary, MD  lisdexamfetamine (VYVANSE) 50 MG capsule Take 1 capsule (50 mg total) by mouth daily. September 2024 10/11/22   Katelyn Canary, MD  metoprolol succinate (TOPROL-XL) 25 MG 24 hr tablet Take 1 tablet (25 mg total) by mouth daily. Take with or immediately following a meal 10/05/22   Copland, Gwenlyn Found, MD  minoxidil (LONITEN) 2.5 MG tablet Take 2.5 mg by mouth daily. 10/18/22   [provider]  Oxymetazoline HCl (RHOFADE) 1 % CREA     [provider]  XIIDRA 5 % SOLN Apply 1 drop to eye 2 (two) times daily. 04/07/21   [provider]    Current Outpatient Medications  Medication Sig Dispense Refill   doxycycline (PERIOSTAT) 20 MG tablet Take 20 mg by mouth 2 (two) times daily.     estradiol (VIVELLE-DOT) 0.05 MG/24HR patch Place 1 patch onto the skin 2 (two) times a week.     finasteride (PROSCAR) 5 MG tablet Take 5 mg by mouth daily.     levonorgestrel (MIRENA) 20 MCG/24HR IUD 1 each by Intrauterine route once.     lisdexamfetamine (VYVANSE) 50 MG capsule Take 1 capsule (50 mg total) by mouth daily. July 2024 30 capsule 0   lisdexamfetamine (VYVANSE) 50 MG capsule Take 1 capsule (50 mg total)  by mouth daily. September 2024 30 capsule 0   lisdexamfetamine (VYVANSE) 50 MG capsule Take 1 capsule (50 mg total) by mouth daily. September 2024 30 capsule 0   metoprolol succinate (TOPROL-XL) 25 MG 24 hr tablet Take 1 tablet (25 mg total) by mouth daily. Take with or immediately following a meal 90 tablet 1   minoxidil (LONITEN) 2.5 MG tablet Take 2.5 mg by mouth daily.     Oxymetazoline HCl (RHOFADE) 1 % CREA      XIIDRA 5 % SOLN Apply 1 drop to eye 2 (two) times daily.     No current facility-administered medications for this visit.    Allergies as of 10/25/2022 - Review Complete 10/25/2022  Allergen Reaction Noted    Adhesive [tape] Rash 06/17/2018    Family History  Problem Relation Age of Onset   Thyroid disease Mother    Colon polyps Father    Ulcers Father        back in the 1960's thinks he had part of his stomach removed   Diabetes Maternal Grandfather        type 2   Diabetes Paternal Grandmother        type 2   Heart attack Paternal Grandfather    Colon cancer Neg Hx    Esophageal cancer Neg Hx    Rectal cancer Neg Hx    Stomach cancer Neg Hx     Social History   Socioeconomic History   Marital status: Married    Spouse name: Not on file   Number of children: 2   Years of education: Not on file   Highest education level: Not on file  Occupational History   Occupation: Hospalist   Tobacco Use   Smoking status: Never   Smokeless tobacco: Never  Vaping Use   Vaping status: Never Used  Substance and Sexual Activity   Alcohol use: Yes    Comment: ocassionally    Drug use: No   Sexual activity: Yes    Birth control/protection: None    Comment: lives with husband and daughter, work at [employer]. No dietary restrictions, MD  Other Topics Concern   Not on file  Social History Narrative   Works as a Licensed conveyancer at [employer] Strain: Not on [employer] Insecurity: Not on file  Transportation Needs: Not on file  Physical Activity: Not on file  Stress: Not on file  Social Connections: Unknown (05/16/2021)   Received from [facility], [facility]   Social Network    Social Network: Not on file  Intimate Partner Violence: Unknown (04/21/2021)   Received from [facility], [facility]   HITS    Physically Hurt: Not on file    Insult or Talk Down To: Not on file    Threaten Physical Harm: Not on file    Scream or Curse: Not on file    Physical Exam: Vital signs in last 24 hours: @There  were no vitals taken for this visit. GEN: NAD EYE: Sclerae anicteric ENT: MMM CV: Non-tachycardic Pulm: CTA b/l GI: Soft,  NT/ND NEURO:  Alert & Oriented x 3   Doristine Locks, DO [facility]   10/25/2022 8:55 AM

## 2022-10-26 ENCOUNTER — Telehealth: Payer: Self-pay

## 2022-10-26 NOTE — Telephone Encounter (Signed)
  Follow up Call-     10/25/2022    9:07 AM 04/05/2020    9:09 AM  Call back number  Post procedure Call Back phone  # [phone]  Permission to leave phone message Yes Yes     Patient questions:  Do you have a fever, pain , or abdominal swelling? No. Pain Score  0 *  Have you tolerated food without any problems? Yes.    Have you been able to return to your normal activities? Yes.    Do you have any questions about your discharge instructions: Diet   No. Medications  No. Follow up visit  No.  Do you have questions or concerns about your Care? No.  Actions: * If pain score is 4 or above: No action needed, pain <4.

## 2022-10-27 LAB — SURGICAL PATHOLOGY

## 2022-11-13 ENCOUNTER — Other Ambulatory Visit (HOSPITAL_BASED_OUTPATIENT_CLINIC_OR_DEPARTMENT_OTHER): Payer: Self-pay

## 2022-11-13 ENCOUNTER — Other Ambulatory Visit: Payer: Self-pay | Admitting: Family Medicine

## 2022-11-13 MED ORDER — LISDEXAMFETAMINE DIMESYLATE 50 MG PO CAPS
50.0000 mg | ORAL_CAPSULE | Freq: Every day | ORAL | 0 refills | Status: DC
  Filled 2022-11-13: qty 30, 30d supply, fill #0

## 2022-11-13 NOTE — Telephone Encounter (Signed)
Requesting: Vyvanse 50mg  Contract: 05/25/2022 UDS: 05/25/2022 Last Visit: 05/25/2022 Next Visit: 11/28/2022 Last Refill: 10/11/2022  Please Advise

## 2022-11-13 NOTE — Telephone Encounter (Signed)
Requesting: Vyvanse 50mg   Contract: 06/09/22 UDS: 05/25/22 Last Visit: 05/25/22 Next Visit: 11/28/22 Last Refill: 10/05/22 #30 and 0RF  Please Advise

## 2022-11-14 ENCOUNTER — Other Ambulatory Visit (HOSPITAL_BASED_OUTPATIENT_CLINIC_OR_DEPARTMENT_OTHER): Payer: Self-pay

## 2022-11-14 ENCOUNTER — Other Ambulatory Visit: Payer: Self-pay

## 2022-11-27 NOTE — Assessment & Plan Note (Signed)
Doing well on Metoprolol, RRR today

## 2022-11-27 NOTE — Assessment & Plan Note (Signed)
Encourage heart healthy diet such as MIND or DASH diet, increase exercise, avoid trans fats, simple carbohydrates and processed foods, consider a krill or fish or flaxseed oil cap daily.  °

## 2022-11-27 NOTE — Assessment & Plan Note (Signed)
Refills given for Vyvanse

## 2022-11-27 NOTE — Assessment & Plan Note (Signed)
Increase leafy greens, consider increased lean red meat and using cast iron cookware. Continue to monitor, report any concerns, check cbc 

## 2022-11-27 NOTE — Assessment & Plan Note (Signed)
hgba1c acceptable, minimize simple carbs. Increase exercise as tolerated.  

## 2022-11-28 ENCOUNTER — Ambulatory Visit: Payer: Managed Care, Other (non HMO) | Admitting: Family Medicine

## 2022-11-28 ENCOUNTER — Encounter: Payer: Self-pay | Admitting: Family Medicine

## 2022-11-28 ENCOUNTER — Other Ambulatory Visit (HOSPITAL_BASED_OUTPATIENT_CLINIC_OR_DEPARTMENT_OTHER): Payer: Self-pay

## 2022-11-28 VITALS — BP 118/60 | HR 75 | Temp 97.8°F | Resp 18 | Ht 63.0 in | Wt 142.4 lb

## 2022-11-28 DIAGNOSIS — F988 Other specified behavioral and emotional disorders with onset usually occurring in childhood and adolescence: Secondary | ICD-10-CM

## 2022-11-28 DIAGNOSIS — R000 Tachycardia, unspecified: Secondary | ICD-10-CM | POA: Diagnosis not present

## 2022-11-28 DIAGNOSIS — R739 Hyperglycemia, unspecified: Secondary | ICD-10-CM

## 2022-11-28 DIAGNOSIS — E785 Hyperlipidemia, unspecified: Secondary | ICD-10-CM

## 2022-11-28 DIAGNOSIS — D509 Iron deficiency anemia, unspecified: Secondary | ICD-10-CM

## 2022-11-28 DIAGNOSIS — L659 Nonscarring hair loss, unspecified: Secondary | ICD-10-CM

## 2022-11-28 MED ORDER — LISDEXAMFETAMINE DIMESYLATE 50 MG PO CAPS
50.0000 mg | ORAL_CAPSULE | Freq: Every day | ORAL | 0 refills | Status: DC
  Filled 2022-11-28 – 2023-02-16 (×3): qty 30, 30d supply, fill #0

## 2022-11-28 MED ORDER — METOPROLOL SUCCINATE ER 25 MG PO TB24
25.0000 mg | ORAL_TABLET | Freq: Every day | ORAL | 1 refills | Status: DC
  Filled 2022-11-28 – 2023-02-16 (×3): qty 90, 90d supply, fill #0

## 2022-11-28 MED ORDER — LISDEXAMFETAMINE DIMESYLATE 50 MG PO CAPS
50.0000 mg | ORAL_CAPSULE | Freq: Every day | ORAL | 0 refills | Status: DC
  Filled 2022-11-28 – 2023-01-11 (×2): qty 30, 30d supply, fill #0

## 2022-11-28 MED ORDER — LISDEXAMFETAMINE DIMESYLATE 50 MG PO CAPS
50.0000 mg | ORAL_CAPSULE | Freq: Every day | ORAL | 0 refills | Status: DC
  Filled 2022-11-28 – 2022-12-13 (×2): qty 30, 30d supply, fill #0

## 2022-11-28 NOTE — Progress Notes (Signed)
Subjective:    Patient ID: Katelyn [name], female    DOB: Jul 25, 1975, 47 y.o.   MRN: [id number]  Chief Complaint  Patient presents with   6 month follow up    Concerns/ questions: Discuss HLD Dx?  Flu shot today: received about 10/17/22     HPI Patient is in today for follow up on chronic medical concerns. No recent febrile illness or acute hospitalizations. She has been following with dermatology to manage her hair loss and notes a good response to current therapy. She continues to do well with her vyvanse and metoprolol and offers no new concerns.   Past Medical History:  Diagnosis Date   Acute bronchitis 01/03/2015   ADD (attention deficit disorder)    Anemia    Anemia, iron deficiency 07/14/2013   Chicken pox as a child   Elevated blood pressure reading 08/11/2016   Fibroid    GERD (gastroesophageal reflux disease)    H/O umbilical hernia repair 07/10/2016   Preventative health care 07/20/2013    Past Surgical History:  Procedure Laterality Date   axillary cyst     left side   MYOMECTOMY  2008   WISDOM TOOTH EXTRACTION      Family History  Problem Relation Age of Onset   Thyroid disease Mother    Colon polyps Father    Ulcers Father        back in the 1960's thinks he had part of his stomach removed   Diabetes Maternal Grandfather        type 2   Diabetes Paternal Grandmother        type 2   Heart attack Paternal Grandfather    Colon cancer Neg Hx    Esophageal cancer Neg Hx    Rectal cancer Neg Hx    Stomach cancer Neg Hx     Social History   Socioeconomic History   Marital status: Married    Spouse name: Not on file   Number of children: 2   Years of education: Not on file   Highest education level: Not on file  Occupational History   Occupation: Hospalist   Tobacco Use   Smoking status: Never   Smokeless tobacco: Never  Vaping Use   Vaping status: Never Used  Substance and Sexual Activity   Alcohol use: Yes    Comment: ocassionally    Drug  use: No   Sexual activity: Yes    Birth control/protection: None    Comment: lives with husband and daughter, work at [employer]. No dietary restrictions, MD  Other Topics Concern   Not on file  Social History Narrative   Works as a Licensed conveyancer at [employer] Strain: Not on [employer] Insecurity: Not on file  Transportation Needs: Not on file  Physical Activity: Not on file  Stress: Not on file  Social Connections: Unknown (05/16/2021)   Received from [facility], [facility]   Social Network    Social Network: Not on file  Intimate Partner Violence: Unknown (04/21/2021)   Received from [facility], [facility]   HITS    Physically Hurt: Not on file    Insult or Talk Down To: Not on file    Threaten Physical Harm: Not on file    Scream or Curse: Not on file    Outpatient Medications Prior to Visit  Medication Sig Dispense Refill   estradiol (VIVELLE-DOT) 0.05 MG/24HR patch Place 1 patch onto  the skin 2 (two) times a week.     finasteride (PROSCAR) 5 MG tablet Take 5 mg by mouth daily.     levonorgestrel (MIRENA) 20 MCG/24HR IUD 1 each by Intrauterine route once.     Oxymetazoline HCl (RHOFADE) 1 % CREA      XIIDRA 5 % SOLN Apply 1 drop to eye 2 (two) times daily.     lisdexamfetamine (VYVANSE) 50 MG capsule Take 1 capsule (50 mg total) by mouth daily. July 2024 30 capsule 0   lisdexamfetamine (VYVANSE) 50 MG capsule Take 1 capsule (50 mg total) by mouth daily. September 2024 30 capsule 0   lisdexamfetamine (VYVANSE) 50 MG capsule Take 1 capsule (50 mg total) by mouth daily. 30 capsule 0   metoprolol succinate (TOPROL-XL) 25 MG 24 hr tablet Take 1 tablet (25 mg total) by mouth daily. Take with or immediately following a meal 90 tablet 1   minoxidil (LONITEN) 2.5 MG tablet Take 2.5 mg by mouth daily.     minoxidil (LONITEN) 2.5 MG tablet Take 0.5 tablets (1.25 mg total) by mouth daily. Actually taking 1/4 tab most days      doxycycline (PERIOSTAT) 20 MG tablet Take 20 mg by mouth 2 (two) times daily. (Patient not taking: Reported on 11/28/2022)     No facility-administered medications prior to visit.    Allergies  Allergen Reactions   Adhesive [Tape] Rash    Oxybutynin patch caused blistering rash    Review of Systems  Constitutional:  Negative for fever and malaise/fatigue.  HENT:  Negative for congestion.   Eyes:  Negative for blurred vision.  Respiratory:  Negative for shortness of breath.   Cardiovascular:  Negative for chest pain, palpitations and leg swelling.  Gastrointestinal:  Negative for abdominal pain, blood in stool and nausea.  Genitourinary:  Negative for dysuria and frequency.  Musculoskeletal:  Negative for falls.  Skin:  Negative for rash.  Neurological:  Negative for dizziness, loss of consciousness and headaches.  Endo/Heme/Allergies:  Negative for environmental allergies.  Psychiatric/Behavioral:  Negative for depression. The patient is not nervous/anxious.        Objective:    Physical Exam Constitutional:      General: She is not in acute distress.    Appearance: Normal appearance. She is well-developed. She is not toxic-appearing.  HENT:     Head: Normocephalic and atraumatic.     Right Ear: External ear normal.     Left Ear: External ear normal.     Nose: Nose normal.  Eyes:     General:        Right eye: No discharge.        Left eye: No discharge.     Conjunctiva/sclera: Conjunctivae normal.  Neck:     Thyroid: No thyromegaly.  Cardiovascular:     Rate and Rhythm: Normal rate and regular rhythm.     Heart sounds: Normal heart sounds. No murmur heard. Pulmonary:     Effort: Pulmonary effort is normal. No respiratory distress.     Breath sounds: Normal breath sounds.  Abdominal:     General: Bowel sounds are normal.     Palpations: Abdomen is soft.     Tenderness: There is no abdominal tenderness. There is no guarding.  Musculoskeletal:        General:  Normal range of motion.     Cervical back: Neck supple.  Lymphadenopathy:     Cervical: No cervical adenopathy.  Skin:    General: Skin is warm  and dry.  Neurological:     Mental Status: She is alert and oriented to person, place, and time.  Psychiatric:        Mood and Affect: Mood normal.        Behavior: Behavior normal.        Thought Content: Thought content normal.        Judgment: Judgment normal.     BP 118/60 (BP Location: Left Arm, Patient Position: Sitting, Cuff Size: Normal)   Pulse 75   Temp 97.8 F (36.6 C) (Temporal)   Resp 18   Ht 5\' 3"  (1.6 m)   Wt 142 lb 6.4 oz (64.6 kg)   SpO2 98%   BMI 25.23 kg/m  Wt Readings from Last 3 Encounters:  11/28/22 142 lb 6.4 oz (64.6 kg)  10/25/22 144 lb 9.6 oz (65.6 kg)  10/19/22 144 lb 9.6 oz (65.6 kg)    Diabetic Foot Exam - Simple   No data filed    Lab Results  Component Value Date   WBC 8.0 05/25/2022   HGB 14.1 05/25/2022   HCT 43.0 05/25/2022   PLT 295.0 05/25/2022   GLUCOSE 80 05/25/2022   CHOL 152 05/25/2022   TRIG 86.0 05/25/2022   HDL 57.30 05/25/2022   LDLCALC 78 05/25/2022   ALT 14 05/25/2022   AST 16 05/25/2022   NA 138 05/25/2022   K 3.8 05/25/2022   CL 103 05/25/2022   CREATININE 0.93 05/25/2022   BUN 10 05/25/2022   CO2 26 05/25/2022   TSH 3.17 05/25/2022   HGBA1C 5.2 05/25/2022    Lab Results  Component Value Date   TSH 3.17 05/25/2022   Lab Results  Component Value Date   WBC 8.0 05/25/2022   HGB 14.1 05/25/2022   HCT 43.0 05/25/2022   MCV 84.0 05/25/2022   PLT 295.0 05/25/2022   Lab Results  Component Value Date   NA 138 05/25/2022   K 3.8 05/25/2022   CO2 26 05/25/2022   GLUCOSE 80 05/25/2022   BUN 10 05/25/2022   CREATININE 0.93 05/25/2022   BILITOT 0.5 05/25/2022   ALKPHOS 53 05/25/2022   AST 16 05/25/2022   ALT 14 05/25/2022   PROT 6.9 05/25/2022   ALBUMIN 4.1 05/25/2022   CALCIUM 8.8 05/25/2022   GFR 73.98 05/25/2022   Lab Results  Component Value Date    CHOL 152 05/25/2022   Lab Results  Component Value Date   HDL 57.30 05/25/2022   Lab Results  Component Value Date   LDLCALC 78 05/25/2022   Lab Results  Component Value Date   TRIG 86.0 05/25/2022   Lab Results  Component Value Date   CHOLHDL 3 05/25/2022   Lab Results  Component Value Date   HGBA1C 5.2 05/25/2022       Assessment & Plan:  Attention deficit disorder, unspecified type Assessment & Plan: Refills given for Vyvanse   Hyperglycemia Assessment & Plan: hgba1c acceptable, minimize simple carbs. Increase exercise as tolerated.    Hyperlipidemia, unspecified hyperlipidemia type Assessment & Plan: Encourage heart healthy diet such as MIND or DASH diet, increase exercise, avoid trans fats, simple carbohydrates and processed foods, consider a krill or fish or flaxseed oil cap daily.     Tachycardia Assessment & Plan: Doing well on Metoprolol, RRR today   Iron deficiency anemia, unspecified iron deficiency anemia type Assessment & Plan: Increase leafy greens, consider increased lean red meat and using cast iron cookware. Continue to monitor, report any concerns, check cbc  Hair loss disorder Assessment & Plan: Has been working with dermatology and notes with combination of Estrogen, Finasteride and Minoxidil her hair is much better.    Other orders -     Lisdexamfetamine Dimesylate; Take 1 capsule (50 mg total) by mouth daily.  Dispense: 30 capsule; Refill: 0 -     Lisdexamfetamine Dimesylate; Take 1 capsule (50 mg total) by mouth daily.  Dispense: 30 capsule; Refill: 0 -     Lisdexamfetamine Dimesylate; Take 1 capsule (50 mg total) by mouth daily.  Dispense: 30 capsule; Refill: 0 -     Metoprolol Succinate ER; Take 1 tablet (25 mg total) by mouth daily. Take with or immediately following a meal  Dispense: 90 tablet; Refill: 1    Danise Edge, MD

## 2022-11-28 NOTE — Patient Instructions (Signed)
[employer] company vitamins  [employer] company probiotic Akermansii and their GLP1 probiotic  [employer] probiotics

## 2022-11-28 NOTE — Assessment & Plan Note (Signed)
Has been working with dermatology and notes with combination of Estrogen, Finasteride and Minoxidil her hair is much better.

## 2022-12-11 ENCOUNTER — Other Ambulatory Visit (HOSPITAL_BASED_OUTPATIENT_CLINIC_OR_DEPARTMENT_OTHER): Payer: Self-pay

## 2022-12-13 ENCOUNTER — Other Ambulatory Visit (HOSPITAL_BASED_OUTPATIENT_CLINIC_OR_DEPARTMENT_OTHER): Payer: Self-pay

## 2023-01-02 ENCOUNTER — Other Ambulatory Visit (HOSPITAL_BASED_OUTPATIENT_CLINIC_OR_DEPARTMENT_OTHER): Payer: Self-pay

## 2023-01-11 ENCOUNTER — Other Ambulatory Visit (HOSPITAL_BASED_OUTPATIENT_CLINIC_OR_DEPARTMENT_OTHER): Payer: Self-pay

## 2023-02-16 ENCOUNTER — Other Ambulatory Visit (HOSPITAL_BASED_OUTPATIENT_CLINIC_OR_DEPARTMENT_OTHER): Payer: Self-pay

## 2023-02-28 NOTE — Assessment & Plan Note (Signed)
hgba1c acceptable, minimize simple carbs. Increase exercise as tolerated.

## 2023-02-28 NOTE — Assessment & Plan Note (Signed)
RRR today

## 2023-02-28 NOTE — Assessment & Plan Note (Signed)
Encourage heart healthy diet such as MIND or DASH diet, increase exercise, avoid trans fats, simple carbohydrates and processed foods, consider a krill or fish or flaxseed oil cap daily.

## 2023-02-28 NOTE — Progress Notes (Unsigned)
MyChart Video Visit    Virtual Visit via Video Note   This patient is at least at moderate risk for complications without adequate follow up. This format is felt to be most appropriate for this patient at this time. Physical exam was limited by quality of the video and audio technology used for the visit. Juanetta, CMA was able to get the patient set up on a video visit.  Patient location: home Patient and provider in visit Provider location: Office  I discussed the limitations of evaluation and management by telemedicine and the availability of in person appointments. The patient expressed understanding and agreed to proceed.  Visit Date: 03/01/2023  Today's healthcare provider: Danise Edge, MD     Subjective:    Patient ID: Katelyn [name], female    DOB: [birth year], 48 y.o.   MRN: [id number]  No chief complaint on file.   HPI Discussed the use of AI scribe software for clinical note transcription with the patient, who gave verbal consent to proceed.  History of Present Illness        Patient is a 48 year old female in today for routine follow up. No recent c/o febrile illness or acute hospitalizations. She reports taking farily good care of herself and staying busy. No c/o HA/f/c/CP/palp/SOB/GI or GU symptoms. Is tolerating her ADD meds and they are working well. No HA or insomnia noted    Past Medical History:  Diagnosis Date   Acute bronchitis 01/03/2015   ADD (attention deficit disorder)    Anemia    Anemia, iron deficiency 07/14/2013   Chicken pox as a child   Elevated blood pressure reading 08/11/2016   Fibroid    GERD (gastroesophageal reflux disease)    H/O umbilical hernia repair 07/10/2016   Preventative health care 07/20/2013    Past Surgical History:  Procedure Laterality Date   axillary cyst     left side   MYOMECTOMY  2008   WISDOM TOOTH EXTRACTION      Family History  Problem Relation Age of Onset   Thyroid disease Mother    Colon polyps  Father    Ulcers Father        back in the 1960's thinks he had part of his stomach removed   Diabetes Maternal Grandfather        type 2   Diabetes Paternal Grandmother        type 2   Heart attack Paternal Grandfather    Colon cancer Neg Hx    Esophageal cancer Neg Hx    Rectal cancer Neg Hx    Stomach cancer Neg Hx     Social History   Socioeconomic History   Marital status: Married    Spouse name: Not on file   Number of children: 2   Years of education: Not on file   Highest education level: Not on file  Occupational History   Occupation: Hospalist   Tobacco Use   Smoking status: Never   Smokeless tobacco: Never  Vaping Use   Vaping status: Never Used  Substance and Sexual Activity   Alcohol use: Yes    Comment: ocassionally    Drug use: No   Sexual activity: Yes    Birth control/protection: None    Comment: lives with husband and daughter, work at [employer]. No dietary restrictions, MD  Other Topics Concern   Not on file  Social History Narrative   Works as a Licensed conveyancer at [employer]   Financial Resource Strain: Not on file  Food Insecurity: Not on file  Transportation Needs: Not on file  Physical Activity: Not on file  Stress: Not on file  Social Connections: Unknown (05/16/2021)   Received from [facility], [facility]   Social Network    Social Network: Not on file  Intimate Partner Violence: Unknown (04/21/2021)   Received from [facility], [facility]   HITS    Physically Hurt: Not on file    Insult or Talk Down To: Not on file    Threaten Physical Harm: Not on file    Scream or Curse: Not on file    Outpatient Medications Prior to Visit  Medication Sig Dispense Refill   estradiol (VIVELLE-DOT) 0.05 MG/24HR patch Place 1 patch onto the skin 2 (two) times a week.     finasteride (PROSCAR) 5 MG tablet Take 5 mg by mouth daily.     levonorgestrel (MIRENA) 20 MCG/24HR IUD 1 each by Intrauterine route once.      minoxidil (LONITEN) 2.5 MG tablet Take 0.5 tablets (1.25 mg total) by mouth daily. Actually taking 1/4 tab most days     Oxymetazoline HCl (RHOFADE) 1 % CREA      XIIDRA 5 % SOLN Apply 1 drop to eye 2 (two) times daily.     lisdexamfetamine (VYVANSE) 50 MG capsule Take 1 capsule (50 mg total) by mouth daily. 30 capsule 0   lisdexamfetamine (VYVANSE) 50 MG capsule Take 1 capsule (50 mg total) by mouth daily. 30 capsule 0   lisdexamfetamine (VYVANSE) 50 MG capsule Take 1 capsule (50 mg total) by mouth daily. 30 capsule 0   metoprolol succinate (TOPROL-XL) 25 MG 24 hr tablet Take 1 tablet (25 mg total) by mouth daily. Take with or immediately following a meal 90 tablet 1   No facility-administered medications prior to visit.    Allergies  Allergen Reactions   Adhesive [Tape] Rash    Oxybutynin patch caused blistering rash    Review of Systems  Constitutional:  Negative for fever and malaise/fatigue.  HENT:  Negative for congestion.   Eyes:  Negative for blurred vision.  Respiratory:  Negative for shortness of breath.   Cardiovascular:  Negative for chest pain, palpitations and leg swelling.  Gastrointestinal:  Negative for abdominal pain, blood in stool and nausea.  Genitourinary:  Negative for dysuria and frequency.  Musculoskeletal:  Negative for falls.  Skin:  Negative for rash.  Neurological:  Negative for dizziness, loss of consciousness and headaches.  Endo/Heme/Allergies:  Negative for environmental allergies.  Psychiatric/Behavioral:  Negative for depression. The patient is not nervous/anxious.        Objective:    Physical Exam Constitutional:      General: She is not in acute distress.    Appearance: Normal appearance. She is not ill-appearing or toxic-appearing.  HENT:     Head: Normocephalic and atraumatic.     Right Ear: External ear normal.     Left Ear: External ear normal.     Nose: Nose normal.  Eyes:     General:        Right eye: No discharge.         Left eye: No discharge.  Pulmonary:     Effort: Pulmonary effort is normal.  Skin:    Findings: No rash.  Neurological:     Mental Status: She is alert and oriented to person, place, and time.  Psychiatric:        Behavior:  Behavior normal.    Ht 5\' 2"  (1.575 m)   Wt 142 lb (64.4 kg)   BMI 25.97 kg/m  Wt Readings from Last 3 Encounters:  03/01/23 142 lb (64.4 kg)  11/28/22 142 lb 6.4 oz (64.6 kg)  10/25/22 144 lb 9.6 oz (65.6 kg)       Assessment & Plan:  Hyperglycemia Assessment & Plan: hgba1c acceptable, minimize simple carbs. Increase exercise as tolerated.    Hyperlipidemia, unspecified hyperlipidemia type Assessment & Plan: Encourage heart healthy diet such as MIND or DASH diet, increase exercise, avoid trans fats, simple carbohydrates and processed foods, consider a krill or fish or flaxseed oil cap daily.     Tachycardia  Attention deficit disorder, unspecified type Assessment & Plan: Doing well on current, no changes. Refills given   Other orders -     Lisdexamfetamine Dimesylate; Take 1 capsule (50 mg total) by mouth daily. February 2025  Dispense: 30 capsule; Refill: 0 -     Lisdexamfetamine Dimesylate; Take 1 capsule (50 mg total) by mouth daily.  Dispense: 30 capsule; Refill: 0 -     Lisdexamfetamine Dimesylate; Take 1 capsule (50 mg total) by mouth daily.  Dispense: 30 capsule; Refill: 0 -     Metoprolol Succinate ER; Take 1 tablet (25 mg total) by mouth daily. Take with or immediately following a meal  Dispense: 90 tablet; Refill: 1     Assessment and Plan              I discussed the assessment and treatment plan with the patient. The patient was provided an opportunity to ask questions and all were answered. The patient agreed with the plan and demonstrated an understanding of the instructions.   The patient was advised to call back or seek an in-person evaluation if the symptoms worsen or if the condition fails to improve as  anticipated.  Danise Edge, MD [facility] [place] [phone] (phone) [phone] (fax)  [facility]

## 2023-03-01 ENCOUNTER — Other Ambulatory Visit (HOSPITAL_BASED_OUTPATIENT_CLINIC_OR_DEPARTMENT_OTHER): Payer: Self-pay

## 2023-03-01 ENCOUNTER — Telehealth (INDEPENDENT_AMBULATORY_CARE_PROVIDER_SITE_OTHER): Payer: Managed Care, Other (non HMO) | Admitting: Family Medicine

## 2023-03-01 VITALS — Ht 62.0 in | Wt 142.0 lb

## 2023-03-01 DIAGNOSIS — F988 Other specified behavioral and emotional disorders with onset usually occurring in childhood and adolescence: Secondary | ICD-10-CM | POA: Diagnosis not present

## 2023-03-01 DIAGNOSIS — E785 Hyperlipidemia, unspecified: Secondary | ICD-10-CM

## 2023-03-01 DIAGNOSIS — R000 Tachycardia, unspecified: Secondary | ICD-10-CM | POA: Diagnosis not present

## 2023-03-01 DIAGNOSIS — R739 Hyperglycemia, unspecified: Secondary | ICD-10-CM

## 2023-03-01 MED ORDER — LISDEXAMFETAMINE DIMESYLATE 50 MG PO CAPS
50.0000 mg | ORAL_CAPSULE | Freq: Every day | ORAL | 0 refills | Status: DC
  Filled 2023-03-01 – 2023-04-20 (×2): qty 30, 30d supply, fill #0

## 2023-03-01 MED ORDER — LISDEXAMFETAMINE DIMESYLATE 50 MG PO CAPS
50.0000 mg | ORAL_CAPSULE | Freq: Every day | ORAL | 0 refills | Status: DC
  Filled 2023-03-01 – 2023-03-19 (×2): qty 30, 30d supply, fill #0

## 2023-03-01 MED ORDER — LISDEXAMFETAMINE DIMESYLATE 50 MG PO CAPS
50.0000 mg | ORAL_CAPSULE | Freq: Every day | ORAL | 0 refills | Status: DC
  Filled 2023-03-01 – 2023-05-22 (×2): qty 30, 30d supply, fill #0

## 2023-03-01 MED ORDER — METOPROLOL SUCCINATE ER 25 MG PO TB24
25.0000 mg | ORAL_TABLET | Freq: Every day | ORAL | 1 refills | Status: DC
  Filled 2023-03-01: qty 90, 90d supply, fill #0
  Filled 2023-03-19: qty 30, 30d supply, fill #0
  Filled 2023-04-20: qty 30, 30d supply, fill #1
  Filled 2023-06-20: qty 30, 30d supply, fill #2
  Filled 2023-07-18: qty 30, 30d supply, fill #3
  Filled 2023-08-15: qty 30, 30d supply, fill #4
  Filled 2023-09-18: qty 30, 30d supply, fill #5

## 2023-03-02 ENCOUNTER — Encounter: Payer: Self-pay | Admitting: Family Medicine

## 2023-03-02 NOTE — Assessment & Plan Note (Signed)
Doing well on current, no changes. Refills given

## 2023-03-19 ENCOUNTER — Other Ambulatory Visit (HOSPITAL_BASED_OUTPATIENT_CLINIC_OR_DEPARTMENT_OTHER): Payer: Self-pay

## 2023-03-19 MED ORDER — PREDNISOLONE ACETATE 1 % OP SUSP
OPHTHALMIC | 0 refills | Status: AC
Start: 2023-03-19 — End: 2023-04-02
  Filled 2023-03-19: qty 5, 14d supply, fill #0

## 2023-03-19 MED ORDER — MOXIFLOXACIN HCL 0.5 % OP SOLN
1.0000 [drp] | Freq: Three times a day (TID) | OPHTHALMIC | 0 refills | Status: DC
  Filled 2023-03-19: qty 3, 7d supply, fill #0

## 2023-04-10 ENCOUNTER — Other Ambulatory Visit: Payer: Self-pay | Admitting: Obstetrics and Gynecology

## 2023-04-10 DIAGNOSIS — R928 Other abnormal and inconclusive findings on diagnostic imaging of breast: Secondary | ICD-10-CM

## 2023-04-12 LAB — HM MAMMOGRAPHY

## 2023-04-20 ENCOUNTER — Other Ambulatory Visit (HOSPITAL_BASED_OUTPATIENT_CLINIC_OR_DEPARTMENT_OTHER): Payer: Self-pay

## 2023-04-24 ENCOUNTER — Encounter

## 2023-04-24 ENCOUNTER — Other Ambulatory Visit

## 2023-05-22 ENCOUNTER — Other Ambulatory Visit (HOSPITAL_BASED_OUTPATIENT_CLINIC_OR_DEPARTMENT_OTHER): Payer: Self-pay

## 2023-06-04 ENCOUNTER — Encounter: Payer: Managed Care, Other (non HMO) | Admitting: Family Medicine

## 2023-06-20 ENCOUNTER — Other Ambulatory Visit (HOSPITAL_BASED_OUTPATIENT_CLINIC_OR_DEPARTMENT_OTHER): Payer: Self-pay

## 2023-06-20 ENCOUNTER — Other Ambulatory Visit: Payer: Self-pay | Admitting: Family Medicine

## 2023-06-20 MED ORDER — LISDEXAMFETAMINE DIMESYLATE 50 MG PO CAPS
50.0000 mg | ORAL_CAPSULE | Freq: Every day | ORAL | 0 refills | Status: DC
  Filled 2023-06-20: qty 30, 30d supply, fill #0

## 2023-06-20 NOTE — Telephone Encounter (Signed)
 Requesting: Vyvanse  50mg   Contract: 05/25/22 UDS: 05/25/22 Last Visit: 03/01/23 Next Visit:  10/08/23 Last Refill: 03/01/23 #30 and 0RF (x3)   Please Advise

## 2023-06-21 ENCOUNTER — Other Ambulatory Visit: Payer: Self-pay

## 2023-07-03 HISTORY — PX: HYSTERECTOMY ABDOMINAL WITH SALPINGECTOMY: SHX6725

## 2023-07-18 ENCOUNTER — Other Ambulatory Visit: Payer: Self-pay | Admitting: Family Medicine

## 2023-07-18 ENCOUNTER — Other Ambulatory Visit (HOSPITAL_BASED_OUTPATIENT_CLINIC_OR_DEPARTMENT_OTHER): Payer: Self-pay

## 2023-07-19 ENCOUNTER — Other Ambulatory Visit (HOSPITAL_BASED_OUTPATIENT_CLINIC_OR_DEPARTMENT_OTHER): Payer: Self-pay

## 2023-07-19 ENCOUNTER — Encounter: Payer: Self-pay | Admitting: Family Medicine

## 2023-07-19 MED ORDER — LISDEXAMFETAMINE DIMESYLATE 50 MG PO CAPS
50.0000 mg | ORAL_CAPSULE | Freq: Every day | ORAL | 0 refills | Status: DC
  Filled 2023-07-19: qty 30, 30d supply, fill #0

## 2023-07-19 NOTE — Telephone Encounter (Signed)
 Copied from CRM [id number]. Topic: Clinical - Medication Question >> Jul 19, 2023 11:18 AM [place] J wrote: Reason for CRM: Patient leaves for vacation on Saturday and would like if her lisdexamfetamine (VYVANSE ) 50 MG capsule could be sent in today since we're closed Friday.   Please call the patient if this is or is not likely to happen. (She is aware of the 3-day turn around time.)

## 2023-07-19 NOTE — Telephone Encounter (Signed)
 Requesting: Vyvnase 50mg   Contract: 05/25/22 UDS: 05/25/22 Last Visit:  03/01/23 Next Visit: 10/08/23 Last Refill: 06/20/23 #30 and 0RF   Please Advise

## 2023-08-15 ENCOUNTER — Other Ambulatory Visit: Payer: Self-pay

## 2023-08-15 ENCOUNTER — Other Ambulatory Visit (HOSPITAL_BASED_OUTPATIENT_CLINIC_OR_DEPARTMENT_OTHER): Payer: Self-pay

## 2023-08-15 ENCOUNTER — Other Ambulatory Visit: Payer: Self-pay | Admitting: Family Medicine

## 2023-08-15 MED ORDER — LISDEXAMFETAMINE DIMESYLATE 50 MG PO CAPS
50.0000 mg | ORAL_CAPSULE | Freq: Every day | ORAL | 0 refills | Status: DC
  Filled 2023-08-15 – 2023-08-21 (×2): qty 30, 30d supply, fill #0

## 2023-08-15 NOTE — Telephone Encounter (Signed)
 Requesting: Vyvanse  50 MG Contract: 05/25/22 UDS: 05/25/22 Last Visit: 11/28/2022 Next Visit: 10/08/2023 Last Refill: 07/19/23  Please Advise

## 2023-08-21 ENCOUNTER — Other Ambulatory Visit (HOSPITAL_BASED_OUTPATIENT_CLINIC_OR_DEPARTMENT_OTHER): Payer: Self-pay

## 2023-08-21 ENCOUNTER — Other Ambulatory Visit: Payer: Self-pay

## 2023-09-18 ENCOUNTER — Other Ambulatory Visit: Payer: Self-pay | Admitting: Family Medicine

## 2023-09-18 ENCOUNTER — Other Ambulatory Visit (HOSPITAL_BASED_OUTPATIENT_CLINIC_OR_DEPARTMENT_OTHER): Payer: Self-pay

## 2023-09-18 MED ORDER — LISDEXAMFETAMINE DIMESYLATE 50 MG PO CAPS
50.0000 mg | ORAL_CAPSULE | Freq: Every day | ORAL | 0 refills | Status: DC
  Filled 2023-09-18 – 2023-09-20 (×2): qty 30, 30d supply, fill #0

## 2023-09-18 NOTE — Telephone Encounter (Signed)
 Requesting: Vyvanse  50mg   Contract:05/25/22 UDS: 05/25/22 Last Visit: 11/28/22 Next Visit: 10/08/23 Last Refill: 08/15/23 #30 and 0RF   Please Advise

## 2023-09-20 ENCOUNTER — Other Ambulatory Visit (HOSPITAL_BASED_OUTPATIENT_CLINIC_OR_DEPARTMENT_OTHER): Payer: Self-pay

## 2023-10-07 NOTE — Assessment & Plan Note (Signed)
 Patient encouraged to maintain heart healthy diet, regular exercise, adequate sleep. Consider daily probiotics. Take medications as prescribed. Follows with [facility] she will follow up with them for pap and mgm last [facility] 03/2023. Colonoscopy 10/2022 repeat in 10/2029. Labs ordered and reviewed. Given and reviewed copy of ACP documents from [employer] and encouraged to complete and return

## 2023-10-07 NOTE — Assessment & Plan Note (Signed)
 Encourage heart healthy diet such as MIND or DASH diet, increase exercise, avoid trans fats, simple carbohydrates and processed foods, consider a krill or fish or flaxseed oil cap daily.

## 2023-10-07 NOTE — Assessment & Plan Note (Signed)
 hgba1c acceptable, minimize simple carbs. Increase exercise as tolerated.

## 2023-10-08 ENCOUNTER — Other Ambulatory Visit (HOSPITAL_BASED_OUTPATIENT_CLINIC_OR_DEPARTMENT_OTHER): Payer: Self-pay

## 2023-10-08 ENCOUNTER — Ambulatory Visit (INDEPENDENT_AMBULATORY_CARE_PROVIDER_SITE_OTHER): Payer: Managed Care, Other (non HMO) | Admitting: Family Medicine

## 2023-10-08 ENCOUNTER — Encounter: Payer: Self-pay | Admitting: Family Medicine

## 2023-10-08 VITALS — BP 116/78 | HR 64 | Temp 97.8°F | Resp 16 | Ht 62.0 in | Wt 151.4 lb

## 2023-10-08 DIAGNOSIS — E785 Hyperlipidemia, unspecified: Secondary | ICD-10-CM | POA: Diagnosis not present

## 2023-10-08 DIAGNOSIS — F988 Other specified behavioral and emotional disorders with onset usually occurring in childhood and adolescence: Secondary | ICD-10-CM

## 2023-10-08 DIAGNOSIS — Z0000 Encounter for general adult medical examination without abnormal findings: Secondary | ICD-10-CM | POA: Diagnosis not present

## 2023-10-08 DIAGNOSIS — R739 Hyperglycemia, unspecified: Secondary | ICD-10-CM

## 2023-10-08 MED ORDER — LISDEXAMFETAMINE DIMESYLATE 50 MG PO CAPS
50.0000 mg | ORAL_CAPSULE | Freq: Every day | ORAL | 0 refills | Status: DC
  Filled 2023-10-08 – 2023-12-20 (×3): qty 30, 30d supply, fill #0

## 2023-10-08 MED ORDER — LISDEXAMFETAMINE DIMESYLATE 50 MG PO CAPS
50.0000 mg | ORAL_CAPSULE | Freq: Every day | ORAL | 0 refills | Status: DC
  Filled 2023-10-08 – 2023-11-22 (×2): qty 30, 30d supply, fill #0

## 2023-10-08 MED ORDER — LISDEXAMFETAMINE DIMESYLATE 50 MG PO CAPS
50.0000 mg | ORAL_CAPSULE | Freq: Every day | ORAL | 0 refills | Status: DC
  Filled 2023-10-08 – 2023-10-22 (×2): qty 30, 30d supply, fill #0

## 2023-10-08 MED ORDER — METOPROLOL SUCCINATE ER 25 MG PO TB24
25.0000 mg | ORAL_TABLET | Freq: Every day | ORAL | 3 refills | Status: AC
  Filled 2023-10-08: qty 90, 90d supply, fill #0
  Filled 2023-10-22: qty 30, 30d supply, fill #0
  Filled 2023-11-22: qty 30, 30d supply, fill #1
  Filled 2023-12-20: qty 30, 30d supply, fill #2
  Filled 2023-12-20 – 2024-01-22 (×3): qty 30, 30d supply, fill #0
  Filled 2024-02-19: qty 30, 30d supply, fill #1

## 2023-10-08 NOTE — Assessment & Plan Note (Signed)
 Refills on Vyvanse 

## 2023-10-08 NOTE — Progress Notes (Signed)
 Subjective:    Patient ID: Katelyn [name], female    DOB: [birth year], 48 y.o.   MRN: [id number]  Chief Complaint  Patient presents with   Annual Exam    Patient presents today for a physical exam   Quality Metric Gaps    Hep B vaccines    HPI Discussed the use of AI scribe software for clinical note transcription with the patient, who gave verbal consent to proceed.  History of Present Illness Katelyn [name] is a 48 year old female who presents for a routine follow-up visit.  She recently underwent a hysterectomy for adenomyosis in June and has recovered well, with no persistent pain or concerns following the surgery.  Additionally, she had a salpingectomy but did not undergo an oophorectomy.  She is currently taking Vyvanse , which was refilled during the visit, and metoprolol , which has been stable. She stays hydrated by adding orange slices to her water, a tip she learned from her daughter's therapist.  She is concerned about a buzzing sound in her ear from the previous night, suspecting a fly might have been involved, but reports no itching or other symptoms.    Past Medical History:  Diagnosis Date   Acute bronchitis 01/03/2015   ADD (attention deficit disorder)    Anemia    Anemia, iron deficiency 07/14/2013   Chicken pox as a child   Elevated blood pressure reading 08/11/2016   Fibroid    GERD (gastroesophageal reflux disease)    H/O umbilical hernia repair 07/10/2016   Preventative health care 07/20/2013    Past Surgical History:  Procedure Laterality Date   axillary cyst     left side   HYSTERECTOMY ABDOMINAL WITH SALPINGECTOMY Bilateral 07/03/2023   MYOMECTOMY  01/16/2006   WISDOM TOOTH EXTRACTION      Family History  Problem Relation Age of Onset   Thyroid  disease Mother    Colon polyps Father    Ulcers Father        back in the 1960's thinks he had part of his stomach removed   Diabetes Maternal Grandfather        type 2   Diabetes Paternal  Grandmother        type 2   Heart attack Paternal Grandfather    Colon cancer Neg Hx    Esophageal cancer Neg Hx    Rectal cancer Neg Hx    Stomach cancer Neg Hx     Social History   Socioeconomic History   Marital status: Married    Spouse name: Not on file   Number of children: 2   Years of education: Not on file   Highest education level: Not on file  Occupational History   Occupation: Hospalist   Tobacco Use   Smoking status: Never   Smokeless tobacco: Never  Vaping Use   Vaping status: Never Used  Substance and Sexual Activity   Alcohol use: Yes    Comment: ocassionally    Drug use: No   Sexual activity: Yes    Birth control/protection: None    Comment: lives with husband and daughter, work at [employer]. No dietary restrictions, MD  Other Topics Concern   Not on file  Social History Narrative   Works as a Licensed conveyancer at [employer] Strain: Not on [employer] Insecurity: Not on file  Transportation Needs: Not on file  Physical Activity: Not on file  Stress: Not on file  Social Connections:  Unknown (05/16/2021)   Received from [facility]   Social Network    Social Network: Not on file  Intimate Partner Violence: Not At Risk (06/22/2023)   Received from [facility]   HITS    Over the last 12 months how often did your partner physically hurt you?: Never    Over the last 12 months how often did your partner insult you or talk down to you?: Never    Over the last 12 months how often did your partner threaten you with physical harm?: Never    Over the last 12 months how often did your partner scream or curse at you?: Never    Outpatient Medications Prior to Visit  Medication Sig Dispense Refill   estradiol  (VIVELLE -DOT) 0.05 MG/24HR patch Place 1 patch onto the skin 2 (two) times a week.     finasteride (PROSCAR) 5 MG tablet Take 5 mg by mouth daily.     metoprolol  succinate (TOPROL -XL) 25 MG 24 hr tablet Take  1 tablet (25 mg total) by mouth daily. Take with or immediately following a meal 90 tablet 1   minoxidil (LONITEN) 2.5 MG tablet Take 0.5 tablets (1.25 mg total) by mouth daily. Actually taking 1/4 tab most days     Oxymetazoline HCl (RHOFADE) 1 % CREA      XIIDRA 5 % SOLN Apply 1 drop to eye 2 (two) times daily.     lisdexamfetamine (VYVANSE ) 50 MG capsule Take 1 capsule (50 mg total) by mouth daily. August 2025 30 capsule 0   levonorgestrel (MIRENA) 20 MCG/24HR IUD 1 each by Intrauterine route once.     moxifloxacin  (VIGAMOX ) 0.5 % ophthalmic solution Place 1 drop into the left eye 3 (three) times daily for 1 week. 3 mL 0   No facility-administered medications prior to visit.    Allergies  Allergen Reactions   Adhesive [Tape] Rash    Oxybutynin patch caused blistering rash    Review of Systems  Constitutional:  Negative for fever and malaise/fatigue.  HENT:  Negative for congestion.   Eyes:  Negative for blurred vision.  Respiratory:  Negative for shortness of breath.   Cardiovascular:  Negative for chest pain, palpitations and leg swelling.  Gastrointestinal:  Negative for abdominal pain, blood in stool and nausea.  Genitourinary:  Negative for dysuria and frequency.  Musculoskeletal:  Negative for falls.  Skin:  Negative for rash.  Neurological:  Negative for dizziness, loss of consciousness and headaches.  Endo/Heme/Allergies:  Negative for environmental allergies.  Psychiatric/Behavioral:  Negative for depression. The patient is not nervous/anxious.        Objective:    Physical Exam Constitutional:      General: She is not in acute distress.    Appearance: Normal appearance. She is well-developed. She is not toxic-appearing.  HENT:     Head: Normocephalic and atraumatic.     Right Ear: External ear normal.     Left Ear: External ear normal.     Nose: Nose normal.  Eyes:     General:        Right eye: No discharge.        Left eye: No discharge.      Conjunctiva/sclera: Conjunctivae normal.  Neck:     Thyroid : No thyromegaly.  Cardiovascular:     Rate and Rhythm: Normal rate and regular rhythm.     Heart sounds: Normal heart sounds. No murmur heard. Pulmonary:     Effort: Pulmonary effort is normal. No respiratory distress.  Breath sounds: Normal breath sounds.  Abdominal:     General: Bowel sounds are normal.     Palpations: Abdomen is soft.     Tenderness: There is no abdominal tenderness. There is no guarding.  Musculoskeletal:        General: Deformity present. Normal range of motion.     Cervical back: Neck supple.  Lymphadenopathy:     Cervical: No cervical adenopathy.  Skin:    General: Skin is warm and dry.  Neurological:     Mental Status: She is alert and oriented to person, place, and time.  Psychiatric:        Mood and Affect: Mood normal.        Behavior: Behavior normal.        Thought Content: Thought content normal.        Judgment: Judgment normal.     BP 116/78   Pulse 64   Temp 97.8 F (36.6 C)   Resp 16   Ht 5' 2 (1.575 m)   Wt 151 lb 6.4 oz (68.7 kg)   LMP 06/11/2023 (Approximate)   SpO2 99%   Breastfeeding No   BMI 27.69 kg/m  Wt Readings from Last 3 Encounters:  10/08/23 151 lb 6.4 oz (68.7 kg)  03/01/23 142 lb (64.4 kg)  11/28/22 142 lb 6.4 oz (64.6 kg)    Diabetic Foot Exam - Simple   No data filed    Lab Results  Component Value Date   WBC 8.0 05/25/2022   HGB 14.1 05/25/2022   HCT 43.0 05/25/2022   PLT 295.0 05/25/2022   GLUCOSE 80 05/25/2022   CHOL 152 05/25/2022   TRIG 86.0 05/25/2022   HDL 57.30 05/25/2022   LDLCALC 78 05/25/2022   ALT 14 05/25/2022   AST 16 05/25/2022   NA 138 05/25/2022   K 3.8 05/25/2022   CL 103 05/25/2022   CREATININE 0.93 05/25/2022   BUN 10 05/25/2022   CO2 26 05/25/2022   TSH 3.17 05/25/2022   HGBA1C 5.2 05/25/2022    Lab Results  Component Value Date   TSH 3.17 05/25/2022   Lab Results  Component Value Date   WBC 8.0  05/25/2022   HGB 14.1 05/25/2022   HCT 43.0 05/25/2022   MCV 84.0 05/25/2022   PLT 295.0 05/25/2022   Lab Results  Component Value Date   NA 138 05/25/2022   K 3.8 05/25/2022   CO2 26 05/25/2022   GLUCOSE 80 05/25/2022   BUN 10 05/25/2022   CREATININE 0.93 05/25/2022   BILITOT 0.5 05/25/2022   ALKPHOS 53 05/25/2022   AST 16 05/25/2022   ALT 14 05/25/2022   PROT 6.9 05/25/2022   ALBUMIN 4.1 05/25/2022   CALCIUM 8.8 05/25/2022   GFR 73.98 05/25/2022   Lab Results  Component Value Date   CHOL 152 05/25/2022   Lab Results  Component Value Date   HDL 57.30 05/25/2022   Lab Results  Component Value Date   LDLCALC 78 05/25/2022   Lab Results  Component Value Date   TRIG 86.0 05/25/2022   Lab Results  Component Value Date   CHOLHDL 3 05/25/2022   Lab Results  Component Value Date   HGBA1C 5.2 05/25/2022       Assessment & Plan:  Hyperglycemia Assessment & Plan: hgba1c acceptable, minimize simple carbs. Increase exercise as tolerated.   Orders: -     Comprehensive metabolic panel with GFR -     Hemoglobin A1c  Hyperlipidemia, unspecified hyperlipidemia type  Assessment & Plan: Encourage heart healthy diet such as MIND or DASH diet, increase exercise, avoid trans fats, simple carbohydrates and processed foods, consider a krill or fish or flaxseed oil cap daily.    Orders: -     CBC with Differential/Platelet -     Lipid panel -     TSH  Preventative health care Assessment & Plan: Patient encouraged to maintain heart healthy diet, regular exercise, adequate sleep. Consider daily probiotics. Take medications as prescribed. Follows with [facility] she will follow up with them for pap and mgm last [facility] 03/2023 repeating this month due to high risk findings but no symptoms Colonoscopy 10/2022 repeat in 10/2029. Labs ordered and reviewed. Given and reviewed copy of ACP documents from [employer] and encouraged to complete and return.  Orders: -      Comprehensive metabolic panel with GFR -     CBC with Differential/Platelet -     Lipid panel -     TSH -     Hemoglobin A1c  Attention deficit disorder, unspecified type -     Drug Monitoring Panel [phone] , Urine  Other orders -     Lisdexamfetamine Dimesylate ; Take 1 capsule (50 mg total) by mouth daily. October 2025  Dispense: 30 capsule; Refill: 0 -     Lisdexamfetamine Dimesylate ; Take 1 capsule (50 mg total) by mouth daily. November 2025  Dispense: 30 capsule; Refill: 0 -     Lisdexamfetamine Dimesylate ; Take 1 capsule (50 mg total) by mouth daily. December 2025  Dispense: 30 capsule; Refill: 0    Assessment and Plan Assessment & Plan Attention-deficit hyperactivity disorder Attention-deficit hyperactivity disorder is well-managed with Vyvanse . - Refill Vyvanse  prescription.  Adult Wellness Visit Routine adult wellness visit with no significant changes in family history. Tetanus immunization is current until 2027. Discussed the importance of advanced directives, annual COVID and influenza vaccinations, maintaining physical activity, and hydration. - Encourage completion of advanced directives and provide to healthcare provider. - Advise annual COVID and influenza vaccinations. - Maintain a minimum of 3,000 steps daily. - Encourage hydration and suggest using True Orange packets for flavoring water. - Return for follow-up in six months.  Recording duration: 18 minutes     Harlene Horton, MD

## 2023-10-08 NOTE — Patient Instructions (Addendum)
 Preventive Care 75-47 Years Old, Female Preventive care refers to lifestyle choices and visits with your health care provider that can promote health and wellness. Preventive care visits are also called wellness exams. TruOrange packetsWhat can I expect for my preventive care visit?  Counseling Your health care provider may ask you questions about your: Medical history, including: Past medical problems. Family medical history. Pregnancy history. Current health, including: Menstrual cycle. Method of birth control. Emotional well-being. Home life and relationship well-being. Sexual activity and sexual health. Lifestyle, including: Alcohol, nicotine or tobacco, and drug use. Access to firearms. Diet, exercise, and sleep habits. Work and work Astronomer. Sunscreen use. Safety issues such as seatbelt and bike helmet use. Physical exam Your health care provider will check your: Height and weight. These may be used to calculate your BMI (body mass index). BMI is a measurement that tells if you are at a healthy weight. Waist circumference. This measures the distance around your waistline. This measurement also tells if you are at a healthy weight and may help predict your risk of certain diseases, such as type 2 diabetes and high blood pressure. Heart rate and blood pressure. Body temperature. Skin for abnormal spots. What immunizations do I need?  Vaccines are usually given at various ages, according to a schedule. Your health care provider will recommend vaccines for you based on your age, medical history, and lifestyle or other factors, such as travel or where you work. What tests do I need? Screening Your health care provider may recommend screening tests for certain conditions. This may include: Lipid and cholesterol levels. Diabetes screening. This is done by checking your blood sugar (glucose) after you have not eaten for a while (fasting). Pelvic exam and Pap test. Hepatitis B  test. Hepatitis C test. HIV (human immunodeficiency virus) test. STI (sexually transmitted infection) testing, if you are at risk. Lung cancer screening. Colorectal cancer screening. Mammogram. Talk with your health care provider about when you should start having regular mammograms. This may depend on whether you have a family history of breast cancer. BRCA-related cancer screening. This may be done if you have a family history of breast, ovarian, tubal, or peritoneal cancers. Bone density scan. This is done to screen for osteoporosis. Talk with your health care provider about your test results, treatment options, and if necessary, the need for more tests. Follow these instructions at home: Eating and drinking  Eat a diet that includes fresh fruits and vegetables, whole grains, lean protein, and low-fat dairy products. Take vitamin and mineral supplements as recommended by your health care provider. Do not drink alcohol if: Your health care provider tells you not to drink. You are pregnant, may be pregnant, or are planning to become pregnant. If you drink alcohol: Limit how much you have to 0-1 drink a day. Know how much alcohol is in your drink. In the [place] one drink equals one 12 oz bottle of beer (355 mL), one 5 oz glass of wine (148 mL), or one 1 oz glass of hard liquor (44 mL). Lifestyle Brush your teeth every morning and night with fluoride toothpaste. Floss one time each day. Exercise for at least 30 minutes 5 or more days each week. Do not use any products that contain nicotine or tobacco. These products include cigarettes, chewing tobacco, and vaping devices, such as e-cigarettes. If you need help quitting, ask your health care provider. Do not use drugs. If you are sexually active, practice safe sex. Use a condom or other form of  protection to prevent STIs. If you do not wish to become pregnant, use a form of birth control. If you plan to become pregnant, see your health care  provider for a prepregnancy visit. Take aspirin only as told by your health care provider. Make sure that you understand how much to take and what form to take. Work with your health care provider to find out whether it is safe and beneficial for you to take aspirin daily. Find healthy ways to manage stress, such as: Meditation, yoga, or listening to music. Journaling. Talking to a trusted person. Spending time with friends and family. Minimize exposure to UV radiation to reduce your risk of skin cancer. Safety Always wear your seat belt while driving or riding in a vehicle. Do not drive: If you have been drinking alcohol. Do not ride with someone who has been drinking. When you are tired or distracted. While texting. If you have been using any mind-altering substances or drugs. Wear a helmet and other protective equipment during sports activities. If you have firearms in your house, make sure you follow all gun safety procedures. Seek help if you have been physically or sexually abused. What's next? Visit your health care provider once a year for an annual wellness visit. Ask your health care provider how often you should have your eyes and teeth checked. Stay up to date on all vaccines. This information is not intended to replace advice given to you by your health care provider. Make sure you discuss any questions you have with your health care provider. Document Revised: 06/30/2020 Document Reviewed: 06/30/2020 Elsevier Patient Education  2024 [employer].

## 2023-10-09 LAB — CBC WITH DIFFERENTIAL/PLATELET
Basophils Absolute: 0 K/uL (ref 0.0–0.1)
Basophils Relative: 0.7 % (ref 0.0–3.0)
Eosinophils Absolute: 0.3 K/uL (ref 0.0–0.7)
Eosinophils Relative: 8.3 % — ABNORMAL HIGH (ref 0.0–5.0)
HCT: 39.8 % (ref 36.0–46.0)
Hemoglobin: 12.9 g/dL (ref 12.0–15.0)
Lymphocytes Relative: 32.6 % (ref 12.0–46.0)
Lymphs Abs: 1.2 K/uL (ref 0.7–4.0)
MCHC: 32.5 g/dL (ref 30.0–36.0)
MCV: 79.7 fl (ref 78.0–100.0)
Monocytes Absolute: 0.4 K/uL (ref 0.1–1.0)
Monocytes Relative: 11.1 % (ref 3.0–12.0)
Neutro Abs: 1.8 K/uL (ref 1.4–7.7)
Neutrophils Relative %: 47.3 % (ref 43.0–77.0)
Platelets: 224 K/uL (ref 150.0–400.0)
RBC: 5 Mil/uL (ref 3.87–5.11)
RDW: 14.5 % (ref 11.5–15.5)
WBC: 3.7 K/uL — ABNORMAL LOW (ref 4.0–10.5)

## 2023-10-09 LAB — COMPREHENSIVE METABOLIC PANEL WITH GFR
ALT: 18 U/L (ref 0–35)
AST: 21 U/L (ref 0–37)
Albumin: 4.1 g/dL (ref 3.5–5.2)
Alkaline Phosphatase: 54 U/L (ref 39–117)
BUN: 15 mg/dL (ref 6–23)
CO2: 28 meq/L (ref 19–32)
Calcium: 9 mg/dL (ref 8.4–10.5)
Chloride: 99 meq/L (ref 96–112)
Creatinine, Ser: 0.79 mg/dL (ref 0.40–1.20)
GFR: 89.11 mL/min (ref >60.00)
Glucose, Bld: 76 mg/dL (ref 70–99)
Potassium: 4.6 meq/L (ref 3.5–5.1)
Sodium: 135 meq/L (ref 135–145)
Total Bilirubin: 0.4 mg/dL (ref 0.2–1.2)
Total Protein: 6.7 g/dL (ref 6.0–8.3)

## 2023-10-09 LAB — LIPID PANEL
Cholesterol: 157 mg/dL (ref 0–200)
HDL: 62.5 mg/dL (ref >39.00)
LDL Cholesterol: 81 mg/dL (ref 0–99)
NonHDL: 94.03
Total CHOL/HDL Ratio: 3
Triglycerides: 65 mg/dL (ref 0.0–149.0)
VLDL: 13 mg/dL (ref 0.0–40.0)

## 2023-10-09 LAB — HEMOGLOBIN A1C: Hgb A1c MFr Bld: 6.3 % (ref 4.6–6.5)

## 2023-10-09 LAB — TSH: TSH: 1.39 u[IU]/mL (ref 0.35–5.50)

## 2023-10-10 ENCOUNTER — Ambulatory Visit: Payer: Self-pay | Admitting: Family Medicine

## 2023-10-10 LAB — DRUG MONITORING PANEL 376104, URINE
Alphahydroxyalprazolam: NEGATIVE ng/mL (ref <25)
Alphahydroxymidazolam: NEGATIVE ng/mL (ref <50)
Alphahydroxytriazolam: NEGATIVE ng/mL (ref <50)
Aminoclonazepam: NEGATIVE ng/mL (ref <25)
Amphetamine: 6719 ng/mL — ABNORMAL HIGH (ref <250)
Amphetamines: POSITIVE ng/mL — AB (ref <500)
Barbiturates: NEGATIVE ng/mL (ref <300)
Benzodiazepines: POSITIVE ng/mL — AB (ref <100)
Cocaine Metabolite: NEGATIVE ng/mL (ref <150)
Desmethyltramadol: NEGATIVE ng/mL (ref <100)
Hydroxyethylflurazepam: NEGATIVE ng/mL (ref <50)
Lorazepam: NEGATIVE ng/mL (ref <50)
Methamphetamine: NEGATIVE ng/mL (ref <250)
Nordiazepam: NEGATIVE ng/mL (ref <50)
Opiates: NEGATIVE ng/mL (ref <100)
Oxazepam: 76 ng/mL — ABNORMAL HIGH (ref <50)
Oxycodone: NEGATIVE ng/mL (ref <100)
Temazepam: 77 ng/mL — ABNORMAL HIGH (ref <50)
Tramadol: NEGATIVE ng/mL (ref <100)

## 2023-10-10 LAB — DM TEMPLATE

## 2023-10-22 ENCOUNTER — Other Ambulatory Visit (HOSPITAL_BASED_OUTPATIENT_CLINIC_OR_DEPARTMENT_OTHER): Payer: Self-pay

## 2023-11-22 ENCOUNTER — Other Ambulatory Visit (HOSPITAL_BASED_OUTPATIENT_CLINIC_OR_DEPARTMENT_OTHER): Payer: Self-pay

## 2023-12-19 ENCOUNTER — Other Ambulatory Visit (HOSPITAL_BASED_OUTPATIENT_CLINIC_OR_DEPARTMENT_OTHER): Payer: Self-pay

## 2023-12-20 ENCOUNTER — Other Ambulatory Visit (HOSPITAL_COMMUNITY): Payer: Self-pay

## 2023-12-20 ENCOUNTER — Other Ambulatory Visit: Payer: Self-pay

## 2023-12-20 ENCOUNTER — Other Ambulatory Visit (HOSPITAL_BASED_OUTPATIENT_CLINIC_OR_DEPARTMENT_OTHER): Payer: Self-pay

## 2024-01-21 ENCOUNTER — Other Ambulatory Visit (HOSPITAL_BASED_OUTPATIENT_CLINIC_OR_DEPARTMENT_OTHER): Payer: Self-pay

## 2024-01-21 ENCOUNTER — Other Ambulatory Visit: Payer: Self-pay

## 2024-01-21 ENCOUNTER — Other Ambulatory Visit: Payer: Self-pay | Admitting: Family Medicine

## 2024-01-21 MED ORDER — LISDEXAMFETAMINE DIMESYLATE 50 MG PO CAPS
50.0000 mg | ORAL_CAPSULE | Freq: Every day | ORAL | 0 refills | Status: AC
  Filled 2024-01-21: qty 30, 30d supply, fill #0

## 2024-01-21 MED ORDER — LISDEXAMFETAMINE DIMESYLATE 50 MG PO CAPS
50.0000 mg | ORAL_CAPSULE | Freq: Every day | ORAL | 0 refills | Status: AC
  Filled 2024-01-21 – 2024-02-19 (×2): qty 30, 30d supply, fill #0

## 2024-01-21 NOTE — Telephone Encounter (Signed)
 Requesting: Vyvanse  50mg   Contract:06/09/22 UDS:10/08/23 Last Visit: 10/08/23 Next Visit: 04/07/24 Last Refill: 10/08/23 #30 and 0RF (x3)  Please Advise

## 2024-01-22 ENCOUNTER — Other Ambulatory Visit: Payer: Self-pay

## 2024-01-22 ENCOUNTER — Other Ambulatory Visit (HOSPITAL_BASED_OUTPATIENT_CLINIC_OR_DEPARTMENT_OTHER): Payer: Self-pay

## 2024-01-23 ENCOUNTER — Other Ambulatory Visit (HOSPITAL_BASED_OUTPATIENT_CLINIC_OR_DEPARTMENT_OTHER): Payer: Self-pay

## 2024-02-18 ENCOUNTER — Other Ambulatory Visit (HOSPITAL_BASED_OUTPATIENT_CLINIC_OR_DEPARTMENT_OTHER): Payer: Self-pay

## 2024-02-19 ENCOUNTER — Other Ambulatory Visit (HOSPITAL_BASED_OUTPATIENT_CLINIC_OR_DEPARTMENT_OTHER): Payer: Self-pay

## 2024-04-07 ENCOUNTER — Ambulatory Visit: Admitting: Family Medicine
